# Patient Record
Sex: Female | Born: 1945 | Race: White | Hispanic: No | Marital: Married | Smoking: Never smoker
Health system: Southern US, Community
[De-identification: ages and names within clinical notes are randomized; demographics above are authoritative.]

## PROBLEM LIST (undated history)

## (undated) DIAGNOSIS — M1990 Unspecified osteoarthritis, unspecified site: Secondary | ICD-10-CM

## (undated) DIAGNOSIS — E041 Nontoxic single thyroid nodule: Secondary | ICD-10-CM

## (undated) DIAGNOSIS — I10 Essential (primary) hypertension: Secondary | ICD-10-CM

## (undated) DIAGNOSIS — IMO0001 Reserved for inherently not codable concepts without codable children: Secondary | ICD-10-CM

## (undated) DIAGNOSIS — R011 Cardiac murmur, unspecified: Secondary | ICD-10-CM

## (undated) DIAGNOSIS — K8020 Calculus of gallbladder without cholecystitis without obstruction: Secondary | ICD-10-CM

## (undated) DIAGNOSIS — E079 Disorder of thyroid, unspecified: Secondary | ICD-10-CM

## (undated) DIAGNOSIS — E785 Hyperlipidemia, unspecified: Secondary | ICD-10-CM

## (undated) DIAGNOSIS — R413 Other amnesia: Secondary | ICD-10-CM

## (undated) DIAGNOSIS — R112 Nausea with vomiting, unspecified: Secondary | ICD-10-CM

## (undated) DIAGNOSIS — J189 Pneumonia, unspecified organism: Secondary | ICD-10-CM

## (undated) DIAGNOSIS — E039 Hypothyroidism, unspecified: Secondary | ICD-10-CM

## (undated) DIAGNOSIS — Z9889 Other specified postprocedural states: Secondary | ICD-10-CM

## (undated) DIAGNOSIS — E559 Vitamin D deficiency, unspecified: Secondary | ICD-10-CM

## (undated) HISTORY — DX: Nontoxic single thyroid nodule: E04.1

## (undated) HISTORY — PX: ABDOMINAL HYSTERECTOMY: SHX81

## (undated) HISTORY — PX: EYE SURGERY: SHX253

## (undated) HISTORY — DX: Disorder of thyroid, unspecified: E07.9

## (undated) HISTORY — DX: Vitamin D deficiency, unspecified: E55.9

## (undated) HISTORY — DX: Hyperlipidemia, unspecified: E78.5

## (undated) HISTORY — DX: Essential (primary) hypertension: I10

## (undated) HISTORY — DX: Other amnesia: R41.3

## (undated) HISTORY — PX: BUNIONECTOMY: SHX129

## (undated) HISTORY — PX: TUBAL LIGATION: SHX77

---

## 1999-10-16 ENCOUNTER — Encounter: Payer: Self-pay | Admitting: Family Medicine

## 1999-10-16 ENCOUNTER — Encounter: Admission: RE | Admit: 1999-10-16 | Discharge: 1999-10-16 | Payer: Self-pay | Admitting: Family Medicine

## 1999-10-22 ENCOUNTER — Encounter: Admission: RE | Admit: 1999-10-22 | Discharge: 1999-10-22 | Payer: Self-pay | Admitting: Family Medicine

## 1999-10-22 ENCOUNTER — Encounter: Payer: Self-pay | Admitting: Family Medicine

## 2001-02-26 ENCOUNTER — Encounter: Payer: Self-pay | Admitting: Family Medicine

## 2001-02-26 ENCOUNTER — Encounter: Admission: RE | Admit: 2001-02-26 | Discharge: 2001-02-26 | Payer: Self-pay | Admitting: Family Medicine

## 2002-03-03 ENCOUNTER — Encounter: Payer: Self-pay | Admitting: Family Medicine

## 2002-03-03 ENCOUNTER — Encounter: Admission: RE | Admit: 2002-03-03 | Discharge: 2002-03-03 | Payer: Self-pay | Admitting: Family Medicine

## 2003-07-08 ENCOUNTER — Encounter: Admission: RE | Admit: 2003-07-08 | Discharge: 2003-07-08 | Payer: Self-pay | Admitting: Family Medicine

## 2003-08-31 ENCOUNTER — Encounter: Admission: RE | Admit: 2003-08-31 | Discharge: 2003-11-29 | Payer: Self-pay | Admitting: Family Medicine

## 2003-11-24 ENCOUNTER — Ambulatory Visit (HOSPITAL_COMMUNITY): Admission: RE | Admit: 2003-11-24 | Discharge: 2003-11-24 | Payer: Self-pay | Admitting: Gastroenterology

## 2005-01-29 ENCOUNTER — Other Ambulatory Visit: Admission: RE | Admit: 2005-01-29 | Discharge: 2005-01-29 | Payer: Self-pay | Admitting: Surgery

## 2005-02-14 ENCOUNTER — Encounter: Admission: RE | Admit: 2005-02-14 | Discharge: 2005-02-14 | Payer: Self-pay | Admitting: Family Medicine

## 2005-03-28 ENCOUNTER — Encounter: Admission: RE | Admit: 2005-03-28 | Discharge: 2005-03-28 | Payer: Self-pay | Admitting: Family Medicine

## 2006-02-20 ENCOUNTER — Encounter: Admission: RE | Admit: 2006-02-20 | Discharge: 2006-02-20 | Payer: Self-pay | Admitting: Family Medicine

## 2007-03-19 ENCOUNTER — Encounter: Admission: RE | Admit: 2007-03-19 | Discharge: 2007-03-19 | Payer: Self-pay | Admitting: Family Medicine

## 2008-02-16 ENCOUNTER — Encounter: Admission: RE | Admit: 2008-02-16 | Discharge: 2008-02-16 | Payer: Self-pay | Admitting: Family Medicine

## 2009-02-20 ENCOUNTER — Encounter: Admission: RE | Admit: 2009-02-20 | Discharge: 2009-02-20 | Payer: Self-pay | Admitting: Family Medicine

## 2010-02-22 ENCOUNTER — Encounter: Admission: RE | Admit: 2010-02-22 | Discharge: 2010-02-22 | Payer: Self-pay | Admitting: Family Medicine

## 2010-10-05 NOTE — Op Note (Signed)
NAME:  Lorraine Buckley, Lorraine Buckley                        ACCOUNT NO.:  [id number]   MEDICAL RECORD NO.:  [id number]                   PATIENT TYPE:  AMB   LOCATION:  ENDO                                 FACILITY:  [facility]   PHYSICIAN:  James L. Malon Kindle., M.D.          DATE OF BIRTH:  January 23, 1946   DATE OF PROCEDURE:  11/24/2003  DATE OF DISCHARGE:                                 OPERATIVE REPORT   PROCEDURE:  Colonoscopy.   MEDICATIONS:  Fentanyl 75 mcg, Versed 7 mg IV.   INDICATIONS FOR PROCEDURE:  Screening in woman with three sisters with colon  polyps.   DESCRIPTION OF PROCEDURE:  The procedure had been explained to the patient  and consent obtained. With the patient in the left lateral decubitus  position, the Olympus scope was inserted and advanced.  The prep was  excellent. We were able to reach the cecum without difficulty. The ileocecal  valve and appendiceal orifice were seen. The scope was withdrawn and the  cecum, ascending colon, transverse colon, descending and sigmoid colon were  seen well no polyps seen. The scope was withdrawn. The rectum was free of  polyps.  The patient tolerated the procedure well.   ASSESSMENT:  Family history of colon polyps with negative colonoscopy.   PLAN:  Will recommend yearly Hemoccults and repeat colonoscopy in five  years.                                               James L. Malon Kindle., M.D.    Waldron Session  D:  11/24/2003  T:  11/24/2003  Job:  [id number]   cc:   Caryn Bee L. Little, M.D.  [address]  [place]  [place] [address]  Fax: [phone]

## 2010-10-12 ENCOUNTER — Encounter (INDEPENDENT_AMBULATORY_CARE_PROVIDER_SITE_OTHER): Payer: Self-pay | Admitting: Surgery

## 2011-01-17 ENCOUNTER — Other Ambulatory Visit: Payer: Self-pay | Admitting: Family Medicine

## 2011-01-17 DIAGNOSIS — Z1231 Encounter for screening mammogram for malignant neoplasm of breast: Secondary | ICD-10-CM

## 2011-02-26 ENCOUNTER — Ambulatory Visit
Admission: RE | Admit: 2011-02-26 | Discharge: 2011-02-26 | Disposition: A | Discharge status: Home / Self Care | Payer: 59 | Source: Ambulatory Visit | Attending: Family Medicine | Admitting: Family Medicine

## 2011-02-26 DIAGNOSIS — Z1231 Encounter for screening mammogram for malignant neoplasm of breast: Secondary | ICD-10-CM

## 2011-05-22 ENCOUNTER — Other Ambulatory Visit (INDEPENDENT_AMBULATORY_CARE_PROVIDER_SITE_OTHER): Payer: Self-pay | Admitting: Surgery

## 2011-05-22 DIAGNOSIS — E042 Nontoxic multinodular goiter: Secondary | ICD-10-CM

## 2011-05-31 ENCOUNTER — Encounter (INDEPENDENT_AMBULATORY_CARE_PROVIDER_SITE_OTHER): Payer: Self-pay

## 2011-06-25 ENCOUNTER — Inpatient Hospital Stay (HOSPITAL_COMMUNITY)
Admission: EM | Admit: 2011-06-25 | Discharge: 2011-06-27 | DRG: 202 | Disposition: A | Discharge status: Home / Self Care | Payer: 59 | Attending: Internal Medicine | Admitting: Internal Medicine

## 2011-06-25 ENCOUNTER — Encounter (HOSPITAL_COMMUNITY): Payer: Self-pay | Admitting: Emergency Medicine

## 2011-06-25 ENCOUNTER — Emergency Department (HOSPITAL_COMMUNITY): Payer: 59

## 2011-06-25 DIAGNOSIS — D649 Anemia, unspecified: Secondary | ICD-10-CM | POA: Diagnosis present

## 2011-06-25 DIAGNOSIS — R0902 Hypoxemia: Secondary | ICD-10-CM

## 2011-06-25 DIAGNOSIS — R509 Fever, unspecified: Secondary | ICD-10-CM

## 2011-06-25 DIAGNOSIS — J209 Acute bronchitis, unspecified: Secondary | ICD-10-CM

## 2011-06-25 DIAGNOSIS — E739 Lactose intolerance, unspecified: Secondary | ICD-10-CM | POA: Diagnosis present

## 2011-06-25 DIAGNOSIS — R7309 Other abnormal glucose: Secondary | ICD-10-CM | POA: Diagnosis present

## 2011-06-25 DIAGNOSIS — I10 Essential (primary) hypertension: Secondary | ICD-10-CM | POA: Diagnosis present

## 2011-06-25 DIAGNOSIS — E039 Hypothyroidism, unspecified: Secondary | ICD-10-CM | POA: Diagnosis present

## 2011-06-25 DIAGNOSIS — J189 Pneumonia, unspecified organism: Secondary | ICD-10-CM | POA: Diagnosis present

## 2011-06-25 DIAGNOSIS — R6889 Other general symptoms and signs: Secondary | ICD-10-CM

## 2011-06-25 DIAGNOSIS — J45909 Unspecified asthma, uncomplicated: Secondary | ICD-10-CM | POA: Diagnosis not present

## 2011-06-25 HISTORY — DX: Other general symptoms and signs: R68.89

## 2011-06-25 HISTORY — DX: Acute bronchitis, unspecified: J20.9

## 2011-06-25 HISTORY — DX: Essential (primary) hypertension: I10

## 2011-06-25 HISTORY — DX: Hypothyroidism, unspecified: E03.9

## 2011-06-25 HISTORY — DX: Cardiac murmur, unspecified: R01.1

## 2011-06-25 HISTORY — DX: Unspecified osteoarthritis, unspecified site: M19.90

## 2011-06-25 LAB — CBC
Hemoglobin: 14 g/dL (ref 12.0–15.0)
MCH: 28.4 pg (ref 26.0–34.0)
Platelets: 215 10*3/uL (ref 150–400)
RBC: 4.93 MIL/uL (ref 3.87–5.11)
WBC: 15.8 10*3/uL — ABNORMAL HIGH (ref 4.0–10.5)

## 2011-06-25 LAB — POCT I-STAT, CHEM 8
BUN: 10 mg/dL (ref 6–23)
Creatinine, Ser: 0.8 mg/dL (ref 0.50–1.10)
Potassium: 4 mEq/L (ref 3.5–5.1)
Sodium: 137 mEq/L (ref 135–145)
TCO2: 23 mmol/L (ref 0–100)

## 2011-06-25 LAB — INFLUENZA PANEL BY PCR (TYPE A & B): Influenza B By PCR: NEGATIVE

## 2011-06-25 MED ORDER — DEXTROSE 5 % IV SOLN
1.0000 g | Freq: Once | INTRAVENOUS | Status: AC
  Administered 2011-06-25: 1 g via INTRAVENOUS
  Filled 2011-06-25: qty 10

## 2011-06-25 MED ORDER — SODIUM CHLORIDE 0.9 % IV BOLUS (SEPSIS)
1000.0000 mL | Freq: Once | INTRAVENOUS | Status: AC
  Administered 2011-06-25: 1000 mL via INTRAVENOUS

## 2011-06-25 MED ORDER — ALBUTEROL SULFATE HFA 108 (90 BASE) MCG/ACT IN AERS: 2.0000 | INHALATION_SPRAY | RESPIRATORY_TRACT | Status: DC | PRN

## 2011-06-25 MED ORDER — OSELTAMIVIR PHOSPHATE 75 MG PO CAPS
75.0000 mg | ORAL_CAPSULE | Freq: Two times a day (BID) | ORAL | Status: DC
  Administered 2011-06-25: 75 mg via ORAL
  Filled 2011-06-25 (×2): qty 1

## 2011-06-25 MED ORDER — ALBUTEROL SULFATE (5 MG/ML) 0.5% IN NEBU
5.0000 mg | INHALATION_SOLUTION | Freq: Once | RESPIRATORY_TRACT | Status: AC
  Administered 2011-06-25: 5 mg via RESPIRATORY_TRACT
  Filled 2011-06-25: qty 1

## 2011-06-25 MED ORDER — LEVOTHYROXINE SODIUM 100 MCG PO TABS
100.0000 ug | ORAL_TABLET | Freq: Every day | ORAL | Status: DC
  Administered 2011-06-25 – 2011-06-27 (×3): 100 ug via ORAL
  Filled 2011-06-25 (×4): qty 1

## 2011-06-25 MED ORDER — ACETAMINOPHEN 500 MG PO TABS
1000.0000 mg | ORAL_TABLET | ORAL | Status: AC
  Filled 2011-06-25: qty 2

## 2011-06-25 MED ORDER — SODIUM CHLORIDE 0.9 % IJ SOLN
3.0000 mL | Freq: Two times a day (BID) | INTRAMUSCULAR | Status: DC
  Administered 2011-06-26 – 2011-06-27 (×2): 3 mL via INTRAVENOUS

## 2011-06-25 MED ORDER — ACETAMINOPHEN 325 MG PO TABS
650.0000 mg | ORAL_TABLET | Freq: Four times a day (QID) | ORAL | Status: DC | PRN
  Administered 2011-06-25 – 2011-06-27 (×4): 650 mg via ORAL
  Filled 2011-06-25 (×4): qty 2

## 2011-06-25 MED ORDER — AZITHROMYCIN 250 MG PO TABS
500.0000 mg | ORAL_TABLET | Freq: Once | ORAL | Status: AC
  Administered 2011-06-25: 500 mg via ORAL
  Filled 2011-06-25: qty 2

## 2011-06-25 MED ORDER — ENOXAPARIN SODIUM 40 MG/0.4ML ~~LOC~~ SOLN
40.0000 mg | SUBCUTANEOUS | Status: DC
  Administered 2011-06-25 – 2011-06-27 (×3): 40 mg via SUBCUTANEOUS
  Filled 2011-06-25 (×3): qty 0.4

## 2011-06-25 MED ORDER — SODIUM CHLORIDE 0.9 % IV SOLN: 250.0000 mL | INTRAVENOUS | Status: DC | PRN

## 2011-06-25 MED ORDER — SODIUM CHLORIDE 0.9 % IV SOLN: INTRAVENOUS | Status: DC

## 2011-06-25 MED ORDER — SODIUM CHLORIDE 0.9 % IV SOLN
INTRAVENOUS | Status: DC
  Administered 2011-06-25 – 2011-06-26 (×3): 100 mL/h via INTRAVENOUS
  Administered 2011-06-26: via INTRAVENOUS

## 2011-06-25 MED ORDER — ONDANSETRON HCL 4 MG/2ML IJ SOLN
4.0000 mg | Freq: Four times a day (QID) | INTRAMUSCULAR | Status: DC | PRN
  Administered 2011-06-25: 4 mg via INTRAVENOUS
  Filled 2011-06-25: qty 2

## 2011-06-25 MED ORDER — ALBUTEROL SULFATE (5 MG/ML) 0.5% IN NEBU: 5.0000 mg | INHALATION_SOLUTION | RESPIRATORY_TRACT | Status: DC | PRN

## 2011-06-25 MED ORDER — GUAIFENESIN-DM 100-10 MG/5ML PO SYRP
5.0000 mL | ORAL_SOLUTION | ORAL | Status: DC | PRN
  Filled 2011-06-25: qty 5

## 2011-06-25 MED ORDER — OSELTAMIVIR PHOSPHATE 75 MG PO CAPS: 75.0000 mg | ORAL_CAPSULE | Freq: Two times a day (BID) | ORAL | Status: DC

## 2011-06-25 MED ORDER — ACETAMINOPHEN 325 MG PO TABS
ORAL_TABLET | ORAL | Status: AC
  Administered 2011-06-25: 975 mg
  Filled 2011-06-25: qty 3

## 2011-06-25 MED ORDER — SODIUM CHLORIDE 0.9 % IJ SOLN: 3.0000 mL | INTRAMUSCULAR | Status: DC | PRN

## 2011-06-25 MED ORDER — MOXIFLOXACIN HCL IN NACL 400 MG/250ML IV SOLN
400.0000 mg | INTRAVENOUS | Status: DC
  Administered 2011-06-25 – 2011-06-27 (×3): 400 mg via INTRAVENOUS
  Filled 2011-06-25 (×3): qty 250

## 2011-06-25 MED ORDER — ACETAMINOPHEN 650 MG RE SUPP: 650.0000 mg | Freq: Four times a day (QID) | RECTAL | Status: DC | PRN

## 2011-06-25 MED ORDER — OSELTAMIVIR PHOSPHATE 75 MG PO CAPS
75.0000 mg | ORAL_CAPSULE | ORAL | Status: AC
  Administered 2011-06-25: 75 mg via ORAL
  Filled 2011-06-25: qty 1

## 2011-06-25 MED ORDER — ALBUTEROL SULFATE (5 MG/ML) 0.5% IN NEBU
2.5000 mg | INHALATION_SOLUTION | Freq: Four times a day (QID) | RESPIRATORY_TRACT | Status: DC
  Administered 2011-06-25 – 2011-06-27 (×9): 2.5 mg via RESPIRATORY_TRACT
  Filled 2011-06-25 (×8): qty 0.5

## 2011-06-25 MED ORDER — BIOTENE DRY MOUTH MT LIQD
15.0000 mL | Freq: Two times a day (BID) | OROMUCOSAL | Status: DC
  Administered 2011-06-25 – 2011-06-27 (×4): 15 mL via OROMUCOSAL

## 2011-06-25 MED ORDER — ALBUTEROL SULFATE (5 MG/ML) 0.5% IN NEBU
2.5000 mg | INHALATION_SOLUTION | RESPIRATORY_TRACT | Status: DC | PRN
  Filled 2011-06-25: qty 0.5

## 2011-06-25 MED ORDER — ONDANSETRON HCL 4 MG/2ML IJ SOLN
4.0000 mg | Freq: Once | INTRAMUSCULAR | Status: AC
  Administered 2011-06-25: 4 mg via INTRAVENOUS
  Filled 2011-06-25: qty 2

## 2011-06-25 NOTE — ED Notes (Signed)
Pt. Feeling better, watching TV, family at bedside.

## 2011-06-25 NOTE — ED Notes (Signed)
Oxygen applied via [place] 2 L

## 2011-06-25 NOTE — ED Notes (Signed)
Patient stated her and her husband went to [facility] and returned here Sunday night.  Patient stated she started with cough and not feeling well after returning home.  +cough yellowish

## 2011-06-25 NOTE — ED Notes (Addendum)
Pt. Having mild nasuea, reported to Dr. Eddie North, MD.  Waiting for orders. Pt. Given coke with ice.

## 2011-06-25 NOTE — Progress Notes (Signed)
Pt arrived to the unit with admitting dx: flu like symptoms. She is alert and oriented x4. Ambulatory. Continent of bowel and bladder. She has no skin break down noted. Pt placed on droplet precautions to r/o flu. Flu PCR was done and sent to the lab. Pt denies any pain. Complains of nausea. Is on 2L O2 via [place]. Pt from home with husband. VS stable. No distress noted. Will cont to monitor.

## 2011-06-25 NOTE — ED Provider Notes (Signed)
History     CSN: [id number]  Arrival date & time 06/25/11  0130   None     Chief Complaint  Patient presents with  . Shortness of Breath    (Consider location/radiation/quality/duration/timing/severity/associated sxs/prior treatment) HPI Complains of cough productive of yellow sputum for one week, worse over the past 24 hours with mild shortness of breath nausea and lightheadedness with standing. Feels dehydrated. Treated with her albuterol inhaler and Pulmicort inhaler with partial relief and also complains of mild sore throat headache and myalgias. Nothing makes symptoms better or worse symptoms are constant moderate. No other associated symptoms Past Medical History  Diagnosis Date  . Thyroid disease   . Asthma   . Hypothyroidism   . Hypertension     Past Surgical History  Procedure Date  . Abdominal hysterectomy     Family History  Problem Relation Age of Onset  . Asthma Mother   . Hypertension Mother     History  Substance Use Topics  . Smoking status: Never Smoker   . Smokeless tobacco: Not on file  . Alcohol Use: No    OB History    Grav Para Term Preterm Abortions TAB SAB Ect Mult Living                  Review of Systems  Constitutional: Positive for fever.  HENT:       Sore throat  Respiratory: Positive for cough.   Cardiovascular: Negative.   Gastrointestinal: Positive for nausea.  Musculoskeletal: Positive for myalgias.  Skin: Negative.   Neurological:       Headache  Hematological: Negative.   Psychiatric/Behavioral: Negative.     Allergies  Penicillins  Home Medications   Current Outpatient Rx  Name Route Sig Dispense Refill  . ALBUTEROL SULFATE HFA 108 (90 BASE) MCG/ACT IN AERS Inhalation Inhale 2 puffs into the lungs every 4 (four) hours as needed. For breathing    . AMLODIPINE BESYLATE 2.5 MG PO TABS Oral Take 2.5 mg by mouth daily.    . BUDESONIDE 180 MCG/ACT IN AEPB Inhalation Inhale 1 puff into the lungs 2 (two) times daily.     [name] LEVOTHYROXINE SODIUM 100 MCG PO TABS Oral Take 100 mcg by mouth daily.      BP 185/100  Pulse 110  Temp(Src) 100.4 F (38 C) (Oral)  Resp 19  SpO2 90%  Physical Exam  Nursing note and vitals reviewed. Constitutional: She appears well-developed and well-nourished.  HENT:  Head: Normocephalic and atraumatic.       Mucous membranes dry, oral pharynx minimally reddened no exudate  Eyes: Conjunctivae are normal. Pupils are equal, round, and reactive to light.  Neck: Neck supple. No tracheal deviation present. No thyromegaly present.  Cardiovascular: Normal rate and regular rhythm.   No murmur heard. Pulmonary/Chest: Effort normal.       Scant diffuse rhonchi  Abdominal: Soft. Bowel sounds are normal. She exhibits no distension. There is no tenderness.  Musculoskeletal: Normal range of motion. She exhibits no edema and no tenderness.  Lymphadenopathy:    She has no cervical adenopathy.  Neurological: She is alert. Coordination normal.  Skin: Skin is warm and dry. No rash noted.  Psychiatric: She has a normal mood and affect.    ED Course  Procedures (including critical care time) Patient felt improved after treatment with intravenous fluids and albuterol neb, how however pulse oximetry drop down to 90% on room air and patient becomes mildly dyspneic Labs Reviewed - No data to  display Dg Chest 2 View  06/25/2011  *RADIOLOGY REPORT*  Clinical Data: Cough, congestion, fever  CHEST - 2 VIEW  Comparison: None.  Findings: Mild perihilar and bibasilar interstitial prominence.  No focal infiltrate or overt edema.  No effusion.  Heart size upper limits normal.  Regional bones unremarkable.  IMPRESSION:  1.  Mild central interstitial prominence, age indeterminate.  No focal infiltrate.  Original Report Authenticated By: Osa Craver, M.D.   Results for orders placed during the hospital encounter of 06/25/11  CBC      Component Value Range   WBC 15.8 (*) 4.0 - 10.5 (K/uL)   RBC  4.93  3.87 - 5.11 (MIL/uL)   Hemoglobin 14.0  12.0 - 15.0 (g/dL)   HCT 16.1  09.6 - 04.5 (%)   MCV 85.0  78.0 - 100.0 (fL)   MCH 28.4  26.0 - 34.0 (pg)   MCHC 33.4  30.0 - 36.0 (g/dL)   RDW 40.9  81.1 - 91.4 (%)   Platelets 215  150 - 400 (K/uL)  POCT I-STAT, CHEM 8      Component Value Range   Sodium 137  135 - 145 (mEq/L)   Potassium 4.0  3.5 - 5.1 (mEq/L)   Chloride 104  96 - 112 (mEq/L)   BUN 10  6 - 23 (mg/dL)   Creatinine, Ser 7.82  0.50 - 1.10 (mg/dL)   Glucose, Bld 956 (*) 70 - 99 (mg/dL)   Calcium, Ion 2.13  0.86 - 1.32 (mmol/L)   TCO2 23  0 - 100 (mmol/L)   Hemoglobin 14.6  12.0 - 15.0 (g/dL)   HCT 57.8  46.9 - 62.9 (%)   Dg Chest 2 View  06/25/2011  *RADIOLOGY REPORT*  Clinical Data: Cough, congestion, fever  CHEST - 2 VIEW  Comparison: None.  Findings: Mild perihilar and bibasilar interstitial prominence.  No focal infiltrate or overt edema.  No effusion.  Heart size upper limits normal.  Regional bones unremarkable.  IMPRESSION:  1.  Mild central interstitial prominence, age indeterminate.  No focal infiltrate.  Original Report Authenticated By: Osa Craver, M.D.    No diagnosis found.    MDM  Symptoms consistent with influenza or pneumonia Patient has oxygen requirement based on symptoms and pulse oximetry Spoke with Dr.Kakrakndy  Plan 23 hour observation , telemetry, oxygen, nebulized treatments as needed, tamiflu  Diagnosis: #16febrile respiratory illness  #2 hypoxia #3hyperglycemia     Doug Sou, MD 06/25/11 [phone]

## 2011-06-25 NOTE — ED Notes (Signed)
PT. REPORTS SOB WITH PRODUCTIVE COUGH ONSET LAST Friday  , NAUSEA AND CHILLS WITH LOW GRADE FEVER THIS EVENING.

## 2011-06-25 NOTE — H&P (Signed)
PCP:  Mickie Hillier, MD, MD   DOA:  06/25/2011  1:32 AM  Chief Complaint:  Productive cough with fever since 3-4 days  HPI: 66 y/o female with hx of hypothyroidism, occasional asthma and recently diagnosed HTN ( not started on her prescription amlodipine yet) resented to ED with cough, fever and sore throat for past 3-4 days. patient and her husband recently had a trip to [place] and was experiencing cough with body aches. She returned home 2 days back and her cough was progressive with yellowish phlegm and subjective fever with chills and bodyaches. Also had some sore throat and chest congestion. She informs having some nausea today but no vomiting. Denies bowel or urinary symptoms. Denies chest pain or  palpitation. Denies hemoptysis or leg swellings. She denies any sick contacts. In the ED she was noted to be febrile to 100.4.   Allergies: Allergies  Allergen Reactions  . Penicillins Other (See Comments)    unknown    Prior to Admission medications   Medication Sig Start Date End Date Taking? Authorizing Provider  albuterol (PROVENTIL HFA;VENTOLIN HFA) 108 (90 BASE) MCG/ACT inhaler Inhale 2 puffs into the lungs every 4 (four) hours as needed. For breathing   Yes Historical Provider, MD  amLODipine (NORVASC) 2.5 MG tablet Take 2.5 mg by mouth daily.   Yes Historical Provider, MD  budesonide (PULMICORT) 180 MCG/ACT inhaler Inhale 1 puff into the lungs 2 (two) times daily.   Yes Historical Provider, MD  levothyroxine (SYNTHROID, LEVOTHROID) 100 MCG tablet Take 100 mcg by mouth daily.   Yes Historical Provider, MD    Past Medical History  Diagnosis Date  . Thyroid disease   . Asthma   . Hypothyroidism   . Hypertension     Past Surgical History  Procedure Date  . Abdominal hysterectomy     Social History:  reports that she has never smoked. She does not have any smokeless tobacco history on file. She reports that she does not drink alcohol or use illicit drugs.  Family History   Problem Relation Age of Onset  . Asthma Mother   . Hypertension Mother     Review of Systems:  Constitutional: fever, chills, denies diaphoresis, appetite change and fatigue.  HEENT: Denies photophobia, eye pain, redness, hearing loss, ear pain, congestion, sore throat, rhinorrhea, sneezing, mouth sores, trouble swallowing, neck pain, neck stiffness and tinnitus.   Respiratory: mild  SOB, DOE, cough with yellowish phlegm, chest tightness,  and wheezing.   Cardiovascular: Denies chest pain, palpitations and leg swelling.  Gastrointestinal: had nausea this morning, denies vomiting, abdominal pain, diarrhea, constipation, blood in stool and abdominal distention.  Genitourinary: Denies dysuria, urgency, frequency, hematuria, flank pain and difficulty urinating.  Musculoskeletal: Denies myalgias, back pain, joint swelling, arthralgias and gait problem.  Skin: Denies pallor, rash and wound.  Neurological: Denies dizziness, seizures, syncope, weakness, light-headedness, numbness and headaches.  Hematological: Denies adenopathy. Easy bruising, personal or family bleeding history  Psychiatric/Behavioral: Denies suicidal ideation, mood changes, confusion, nervousness, sleep disturbance and agitation   Physical Exam:  Filed Vitals:   06/25/11 0500 06/25/11 0515 06/25/11 0523 06/25/11 0743  BP:   135/74 114/59  Pulse: 120 116 112   Temp:   99.5 F (37.5 C) 98.4 F (36.9 C)  TempSrc:   Oral Oral  Resp:   20 20  SpO2: 95% 93% 91% 98%    Constitutional: Vital signs reviewed.  Patient is a well-developed and well-nourished in no acute distress and cooperative with exam. Head:  Normocephalic and atraumatic Ear: TM normal bilaterally Mouth: no erythema or exudates, MMM Eyes: PERRL, EOMI, conjunctivae normal, No scleral icterus.  Neck: Supple, Trachea midline normal ROM, No JVD, mass, thyromegaly, or carotid bruit present.  Cardiovascular: RRR, S1 normal, S2 normal, no MRG, pulses symmetric and  intact bilaterally Pulmonary/Chest: CTAB, no wheezes, rales, or rhonchi Abdominal: Soft. Non-tender, non-distended, bowel sounds are normal, no masses, organomegaly, or guarding present.  GU: no CVA tenderness Musculoskeletal: No joint deformities, erythema, or stiffness, ROM full and no nontender Ext: no edema and no cyanosis, pulses palpable bilaterally (DP and PT) Hematology: no cervical, inginal, or axillary adenopathy.  Neurological: A&O x3, Strenght is normal and symmetric bilaterally, cranial nerve II-XII are grossly intact, no focal motor deficit, sensory intact to light touch bilaterally.  Skin: Warm, dry and intact. No rash, cyanosis, or clubbing.  Psychiatric: Normal mood and affect. speech and behavior is normal. Judgment and thought content normal. Cognition and memory are normal.   Labs on Admission:  Results for orders placed during the hospital encounter of 06/25/11 (from the past 48 hour(s))  POCT I-STAT, CHEM 8     Status: Abnormal   Collection Time   06/25/11  3:14 AM      Component Value Range Comment   Sodium 137  135 - 145 (mEq/L)    Potassium 4.0  3.5 - 5.1 (mEq/L)    Chloride 104  96 - 112 (mEq/L)    BUN 10  6 - 23 (mg/dL)    Creatinine, Ser 1.61  0.50 - 1.10 (mg/dL)    Glucose, Bld 096 (*) 70 - 99 (mg/dL)    Calcium, Ion 0.45  1.12 - 1.32 (mmol/L)    TCO2 23  0 - 100 (mmol/L)    Hemoglobin 14.6  12.0 - 15.0 (g/dL)    HCT 40.9  81.1 - 91.4 (%)   CBC     Status: Abnormal   Collection Time   06/25/11  3:16 AM      Component Value Range Comment   WBC 15.8 (*) 4.0 - 10.5 (K/uL)    RBC 4.93  3.87 - 5.11 (MIL/uL)    Hemoglobin 14.0  12.0 - 15.0 (g/dL)    HCT 78.2  95.6 - 21.3 (%)    MCV 85.0  78.0 - 100.0 (fL)    MCH 28.4  26.0 - 34.0 (pg)    MCHC 33.4  30.0 - 36.0 (g/dL)    RDW 08.6  57.8 - 46.9 (%)    Platelets 215  150 - 400 (K/uL)     Radiological Exams on Admission: CXR wnl   Assessment  66 y/o female with hypothyroidism presented with flu like  illness and acute bronchitis  PLAN:  Flu like illness  admit under observation  droplet precaution  will check flu PCR. Patient started on tamiflu Monitor temp and wbc ( elevated on presentation)  CXR negative for PNA cont IV fluids and albuterol nebs Prn zofran  Acute bronchitis  will switch abx to avelox Cont nebs Tylenol prn  Hypothyroidism Cont synthroid  Hypertension  currently stable  has not started on prescription meds at home ( amlodipine)   will stat while in hospital   DVT prophylaxis  Diet: low Na   full code Time Spent on Admission: 55 minutes  Lorraine Buckley 06/25/2011, 8:06 AM

## 2011-06-26 ENCOUNTER — Inpatient Hospital Stay (HOSPITAL_COMMUNITY): Payer: 59

## 2011-06-26 LAB — CBC
HCT: 33.1 % — ABNORMAL LOW (ref 36.0–46.0)
Hemoglobin: 10.7 g/dL — ABNORMAL LOW (ref 12.0–15.0)
MCH: 28.4 pg (ref 26.0–34.0)
RBC: 3.77 MIL/uL — ABNORMAL LOW (ref 3.87–5.11)

## 2011-06-26 NOTE — Progress Notes (Signed)
Utilization review complete 

## 2011-06-26 NOTE — Progress Notes (Signed)
Subjective: Chart reviewed. Overall patient says she's feeling much better. Improving cough and sputum is clearing. Denies dyspnea. Good appetite. No history of sleep apnea.  Objective: Blood pressure 139/68, pulse 85, temperature 97.7 F (36.5 C), temperature source Oral, resp. rate 18, height 5\' 7"  (1.702 m), weight 103.6 kg (228 lb 6.3 oz), SpO2 96.00%.  Intake/Output Summary (Last 24 hours) at 06/26/11 1750 Last data filed at 06/26/11 1300  Gross per 24 hour  Intake   1909 ml  Output      1 ml  Net   1908 ml    General Exam: Comfortable. Respiratory System: Reduced breath sounds in the bases with bibasal crackles, right greater than left. Rest of the lung fields are clear. No increased work of breathing. Cardiovascular System: First and second heart sounds heard. Regular rate and rhythm. No JVD/murmurs or pedal edema. Gastrointestinal System: Abdomen is non distended, soft and normal bowel sounds heard. Central Nervous System: Alert and oriented. No focal neurological deficits.  Lab Results: Basic Metabolic Panel:  Basename 06/25/11 0314  NA 137  K 4.0  CL 104  CO2 --  GLUCOSE 162*  BUN 10  CREATININE 0.80  CALCIUM --  MG --  PHOS --   Liver Function Tests: No results found for this basename: AST:2,ALT:2,ALKPHOS:2,BILITOT:2,PROT:2,ALBUMIN:2 in the last 72 hours No results found for this basename: LIPASE:2,AMYLASE:2 in the last 72 hours No results found for this basename: AMMONIA:2 in the last 72 hours CBC:  Basename 06/26/11 0540 06/25/11 0316  WBC 18.6* 15.8*  NEUTROABS -- --  HGB 10.7* 14.0  HCT 33.1* 41.9  MCV 87.8 85.0  PLT PLATELETS APPEAR ADEQUATE 215   Cardiac Enzymes: No results found for this basename: CKTOTAL:3,CKMB:3,CKMBINDEX:3,TROPONINI:3 in the last 72 hours BNP: No results found for this basename: PROBNP:3 in the last 72 hours D-Dimer: No results found for this basename: DDIMER:2 in the last 72 hours CBG: No results found for this basename:  GLUCAP:6 in the last 72 hours Hemoglobin A1C: No results found for this basename: HGBA1C in the last 72 hours Fasting Lipid Panel: No results found for this basename: CHOL,HDL,LDLCALC,TRIG,CHOLHDL,LDLDIRECT in the last 72 hours Thyroid Function Tests: No results found for this basename: TSH,T4TOTAL,FREET4,T3FREE,THYROIDAB in the last 72 hours Anemia Panel: No results found for this basename: VITAMINB12,FOLATE,FERRITIN,TIBC,IRON,RETICCTPCT in the last 72 hours Coagulation: No results found for this basename: LABPROT:2,INR:2 in the last 72 hours Urine Drug Screen: Drugs of Abuse  No results found for this basename: labopia,  cocainscrnur,  labbenz,  amphetmu,  thcu,  labbarb    Alcohol Level: No results found for this basename: ETH:2 in the last 72 hours Urinalysis: No results found for this basename: COLORURINE:2,APPERANCEUR:2,LABSPEC:2,PHURINE:2,GLUCOSEU:2,HGBUR:2,BILIRUBINUR:2,KETONESUR:2,PROTEINUR:2,UROBILINOGEN:2,NITRITE:2,LEUKOCYTESUR:2 in the last 72 hours   Micro Results: No results found for this or any previous visit (from the past 240 hour(s)).  Studies/Results: Dg Chest 2 View  06/25/2011  *RADIOLOGY REPORT*  Clinical Data: Cough, congestion, fever  CHEST - 2 VIEW  Comparison: None.  Findings: Mild perihilar and bibasilar interstitial prominence.  No focal infiltrate or overt edema.  No effusion.  Heart size upper limits normal.  Regional bones unremarkable.  IMPRESSION:  1.  Mild central interstitial prominence, age indeterminate.  No focal infiltrate.  Original Report Authenticated By: Osa Craver, M.D.    Medications: Scheduled Meds:    . acetaminophen  1,000 mg Oral To Major  . albuterol  2.5 mg Nebulization Q6H  . antiseptic oral rinse  15 mL Mouth Rinse BID  . enoxaparin  40 mg Subcutaneous Q24H  . levothyroxine  100 mcg Oral Daily  . moxifloxacin  400 mg Intravenous Q24H  . sodium chloride  3 mL Intravenous Q12H  . DISCONTD: oseltamivir  75 mg Oral  BID   Continuous Infusions:    . sodium chloride 100 mL/hr (06/26/11 0944)   PRN Meds:.sodium chloride, acetaminophen, acetaminophen, albuterol, albuterol, guaiFENesin-dextromethorphan, ondansetron (ZOFRAN) IV, sodium chloride  Assessment/Plan: 1. Acute purulent bronchitis. Improving. No history of smoking. Influenza panel negative. Repeat CXR to rule out Pneumonia. Continue Avelox. 2. Anemia: ? dilutional. Followup CBC tomorrow. 3. Hyperglycemia: Check hemoglobin A1c. 4. Hypothyroidism 5. Hypertension: Controlled off medications   Disposition: Possible discharge tomorrow. Discussed with patient spouse who was at the bedside.  HONGALGI,ANAND 06/26/2011, 5:50 PM

## 2011-06-26 NOTE — Progress Notes (Signed)
Pt's O2 was turned off to see how pt would do on room air and her sats dropped to 89-90%. Pt denied any sob and no distress present. Pt was put back on 2L sats increased to 94%. Will cont to monitor.

## 2011-06-27 LAB — CBC
HCT: 34.5 % — ABNORMAL LOW (ref 36.0–46.0)
MCH: 27.9 pg (ref 26.0–34.0)
MCV: 87.6 fL (ref 78.0–100.0)
RDW: 14.2 % (ref 11.5–15.5)
WBC: 9.3 10*3/uL (ref 4.0–10.5)

## 2011-06-27 LAB — BASIC METABOLIC PANEL
BUN: 8 mg/dL (ref 6–23)
CO2: 24 mEq/L (ref 19–32)
Chloride: 107 mEq/L (ref 96–112)
Creatinine, Ser: 0.69 mg/dL (ref 0.50–1.10)
GFR calc Af Amer: >90 mL/min (ref >90)

## 2011-06-27 MED ORDER — MOXIFLOXACIN HCL 400 MG PO TABS: 400.0000 mg | ORAL_TABLET | Freq: Every day | ORAL | Status: AC

## 2011-06-27 NOTE — Progress Notes (Signed)
06/27/2011 [facility], Bosie Clos SPARKS Case Management Note [phone]    CARE MANAGEMENT NOTE 06/27/2011  Patient:  Lorraine Buckley, Lorraine Buckley   Account Number:  [id number]  Date Initiated:  06/26/2011  Documentation initiated by:  Fransico Michael  Subjective/Objective Assessment:   admitted on 06/25/11 with productive cough and fever     Action/Plan:   prior to admission, patient lived at home with family. Independent with ADLs   Anticipated DC Date:  06/30/2011   Anticipated DC Plan:  HOME/SELF CARE      DC Planning Services  CM consult      Choice offered to / List presented to:             Status of service:  In process, will continue to follow Medicare Important Message given?   (If response is "NO", the following Medicare IM given date fields will be blank) Date Medicare IM given:   Date Additional Medicare IM given:    Discharge Disposition:    Per UR Regulation:  Reviewed for med. necessity/level of care/duration of stay  Comments:  06/26/11-1437-J.Lutricia Horsfall  [phone]      66yo female admitted on 06/25/11 with productive cough and fever. Prior to admission, patient lived at home with family support. Independent with ADLs. In to see patient. No home needs or discharge needs identified. CM will continue to monitor.

## 2011-06-27 NOTE — Discharge Summary (Signed)
Discharge Summary  Lorraine Buckley MR#: [id number]  DOB:1945/11/08  Date of Admission: 06/25/2011 Date of Discharge: 06/27/2011  Patient's PCP: Mickie Hillier, MD, MD  Attending Physician:Abb Gobert  Consults: None  Discharge Diagnoses: 1. Acute bronchitis Vs ?? Community Acquired Pneumonia. 2. Anemia 3. Hyperglycemia/glucose intolerance 4. Hypothyroidism 5. Hypertension 6. Asthma   Brief Admitting History and Physical 66 y/o female with hx of hypothyroidism, occasional asthma and recently diagnosed HTN ( not started on her prescription amlodipine yet) resented to ED with cough, fever and sore throat for past 3-4 days. patient and her husband recently had a trip to [place] and was experiencing cough with body aches. She returned home 2 days back and her cough was progressive with yellowish phlegm and subjective fever with chills and bodyaches. Also had some sore throat and chest congestion.She denied any sick contacts. In the ED she was noted to be febrile to 100.4. She was mildly tachycardic. Lung exam was clear. White cell count was 16,000. Chest x-ray did not show any obvious infiltrate.    Discharge Medications Current Discharge Medication List    START taking these medications   Details  moxifloxacin (AVELOX) 400 MG tablet Take 1 tablet (400 mg total) by mouth daily. Qty: 2 tablet, Refills: 0      CONTINUE these medications which have NOT CHANGED   Details  albuterol (PROVENTIL HFA;VENTOLIN HFA) 108 (90 BASE) MCG/ACT inhaler Inhale 2 puffs into the lungs every 4 (four) hours as needed. For breathing    amLODipine (NORVASC) 2.5 MG tablet Take 2.5 mg by mouth daily.    budesonide (PULMICORT) 180 MCG/ACT inhaler Inhale 1 puff into the lungs 2 (two) times daily.    levothyroxine (SYNTHROID, LEVOTHROID) 100 MCG tablet Take 100 mcg by mouth daily.      STOP taking these medications     Chlorpheniramine Maleate (ALLERGY PO) Comments:  Reason for Stopping:       cycloSPORINE (RESTASIS) 0.05 % ophthalmic emulsion Comments:  Reason for Stopping:       thyroid (ARMOUR) 120 MG tablet Comments:  Reason for Stopping:          Hospital Course: 1. Acute purulent bronchitis versus ?? Community Acquired PNA. Patient had flulike symptoms. She was initially placed on droplet isolation and received a dose of Tamiflu. These were discontinued once her influenza panel was negative. She was started on intravenous moxifloxacin (day 3), bronchodilator nebulizations and oxygen. With these measures patient has made a good recovery. She has mild cough with sputum that is clearing up. She denies dyspnea or chest pain. She complains of an occasional wheeze. Her appetite is improved. Her oxygen saturations on room air up to 96%. She will complete a total of 7 days course of antibiotics.   2. Anemia: Stable. Outpatient followup 3. Hyperglycemia/glucose intolerance: Recommend diet and exercise and weight loss. Patient has family history of diabetes.. 4. Hypothyroidism: Continue Synthroid 5. Hypertension: A new prescription has been called in by her primary care physician which she has not picked up yet. She is advised to start the amlodipine.   Day of Discharge BP 164/96  Pulse 64  Temp(Src) 98.4 F (36.9 C) (Oral)  Resp 20  Ht 5\' 7"  (1.702 m)  Wt 104.6 kg (230 lb 9.6 oz)  BMI 36.12 kg/m2  SpO2 96% General Exam: Comfortable.  Respiratory System: Reduced breath sounds in the bases with bibasal crackles, right greater than left(improving). Rest of the lung fields are clear. No increased work of breathing.  Cardiovascular System:  First and second heart sounds heard. Regular rate and rhythm. No JVD/murmurs or pedal edema.  Gastrointestinal System: Abdomen is non distended, soft and normal bowel sounds heard.  Central Nervous System: Alert and oriented. No focal neurological deficits.  Results for orders placed during the hospital encounter of 06/25/11 (from the past 48  hour(s))  CBC     Status: Abnormal   Collection Time   06/26/11  5:40 AM      Component Value Range Comment   WBC 18.6 (*) 4.0 - 10.5 (K/uL) WHITE COUNT CONFIRMED ON SMEAR   RBC 3.77 (*) 3.87 - 5.11 (MIL/uL)    Hemoglobin 10.7 (*) 12.0 - 15.0 (g/dL)    HCT 16.1 (*) 09.6 - 46.0 (%)    MCV 87.8  78.0 - 100.0 (fL)    MCH 28.4  26.0 - 34.0 (pg)    MCHC 32.3  30.0 - 36.0 (g/dL)    RDW 04.5  40.9 - 81.1 (%)    Platelets PLATELETS APPEAR ADEQUATE  150 - 400 (K/uL) PLATELET COUNT CONFIRMED BY SMEAR  CBC     Status: Abnormal   Collection Time   06/27/11  6:47 AM      Component Value Range Comment   WBC 9.3  4.0 - 10.5 (K/uL)    RBC 3.94  3.87 - 5.11 (MIL/uL)    Hemoglobin 11.0 (*) 12.0 - 15.0 (g/dL)    HCT 91.4 (*) 78.2 - 46.0 (%)    MCV 87.6  78.0 - 100.0 (fL)    MCH 27.9  26.0 - 34.0 (pg)    MCHC 31.9  30.0 - 36.0 (g/dL)    RDW 95.6  21.3 - 08.6 (%)    Platelets 208  150 - 400 (K/uL)   BASIC METABOLIC PANEL     Status: Normal   Collection Time   06/27/11  6:47 AM      Component Value Range Comment   Sodium 141  135 - 145 (mEq/L)    Potassium 3.6  3.5 - 5.1 (mEq/L)    Chloride 107  96 - 112 (mEq/L)    CO2 24  19 - 32 (mEq/L)    Glucose, Bld 98  70 - 99 (mg/dL)    BUN 8  6 - 23 (mg/dL)    Creatinine, Ser 5.78  0.50 - 1.10 (mg/dL)    Calcium 8.8  8.4 - 10.5 (mg/dL)    GFR calc non Af Amer >90  >90 (mL/min)    GFR calc Af Amer >90  >90 (mL/min)   HEMOGLOBIN A1C     Status: Abnormal   Collection Time   06/27/11  6:47 AM      Component Value Range Comment   Hemoglobin A1C 6.3 (*) <5.7 (%)    Mean Plasma Glucose 134 (*) <117 (mg/dL)     Dg Chest 2 View  08/24/9627  *RADIOLOGY REPORT*  Clinical Data: Chest pain.  CHEST - 2 VIEW  Comparison: 06/25/2011  Findings: Asymmetric elevation of the right hemidiaphragm noted. There is bibasilar atelectasis or infiltrate.  No substantial pleural effusion.  Cardiopericardial silhouette is at upper limits of normal for size. Imaged bony structures of  the thorax are intact.  IMPRESSION: Bibasilar atelectasis or infiltrate.  Original Report Authenticated By: ERIC A. MANSELL, M.D.   Dg Chest 2 View  06/25/2011  *RADIOLOGY REPORT*  Clinical Data: Cough, congestion, fever  CHEST - 2 VIEW  Comparison: None.  Findings: Mild perihilar and bibasilar interstitial prominence.  No focal infiltrate or overt  edema.  No effusion.  Heart size upper limits normal.  Regional bones unremarkable.  IMPRESSION:  1.  Mild central interstitial prominence, age indeterminate.  No focal infiltrate.  Original Report Authenticated By: Osa Craver, M.D.     Disposition: Discharged home in stable condition  Diet: Heart healthy diet.  Activity: Increase activity gradually.  Follow-up Appts: Patient is advised to call her primary care physician to be seen in 4-5 days from hospital discharge.  TESTS THAT NEED FOLLOW-UP Chest X Ray in 3-4 weeks from hospital discharge.  Time spent on discharge, talking to the patient, and coordinating care: 30 mins.   SignedMarcellus Scott, MD 06/27/2011, 3:19 PM

## 2011-06-27 NOTE — Progress Notes (Signed)
Pt. discharged to home. Pt after visit summary reviewed and pt capable of re verbalizing medications and follow up appointments. Pt remains stable. No signs and symptoms of distress. Educated to return to ER in the event of SOB, dizziness, chest pain, or fainting. Burley Saver, RN  Pt. discharged to home. Pt after visit summary reviewed and pt capable of re verbalizing medications and follow up appointments. Pt remains stable. No signs and symptoms of distress. Educated to return to ER in the event of SOB, dizziness, chest pain, or fainting. Burley Saver, RN

## 2011-10-07 ENCOUNTER — Ambulatory Visit
Admission: RE | Admit: 2011-10-07 | Discharge: 2011-10-07 | Disposition: A | Discharge status: Home / Self Care | Payer: 59 | Source: Ambulatory Visit | Attending: Surgery | Admitting: Surgery

## 2011-10-07 DIAGNOSIS — E042 Nontoxic multinodular goiter: Secondary | ICD-10-CM

## 2011-10-09 ENCOUNTER — Ambulatory Visit
Admission: RE | Admit: 2011-10-09 | Discharge: 2011-10-09 | Disposition: A | Discharge status: Home / Self Care | Payer: 59 | Source: Ambulatory Visit | Attending: Surgery | Admitting: Surgery

## 2011-10-09 ENCOUNTER — Telehealth (INDEPENDENT_AMBULATORY_CARE_PROVIDER_SITE_OTHER): Payer: Self-pay

## 2011-10-09 ENCOUNTER — Other Ambulatory Visit (INDEPENDENT_AMBULATORY_CARE_PROVIDER_SITE_OTHER): Payer: Self-pay

## 2011-10-09 ENCOUNTER — Other Ambulatory Visit (HOSPITAL_COMMUNITY)
Admission: RE | Admit: 2011-10-09 | Discharge: 2011-10-09 | Disposition: A | Discharge status: Home / Self Care | Payer: 59 | Source: Ambulatory Visit | Attending: Diagnostic Radiology | Admitting: Diagnostic Radiology

## 2011-10-09 ENCOUNTER — Other Ambulatory Visit (INDEPENDENT_AMBULATORY_CARE_PROVIDER_SITE_OTHER): Payer: Self-pay | Admitting: Surgery

## 2011-10-09 DIAGNOSIS — E042 Nontoxic multinodular goiter: Secondary | ICD-10-CM

## 2011-10-09 DIAGNOSIS — E041 Nontoxic single thyroid nodule: Secondary | ICD-10-CM | POA: Insufficient documentation

## 2011-10-09 NOTE — Telephone Encounter (Signed)
Copy of 2009 thyroid u/s and 2006 pathology results faxed to Tammy w/[facility].

## 2011-10-11 ENCOUNTER — Telehealth (INDEPENDENT_AMBULATORY_CARE_PROVIDER_SITE_OTHER): Payer: Self-pay

## 2011-10-11 NOTE — Telephone Encounter (Signed)
Pt notified of path result and to keep f/u appt 5-29 to discuss treatment plan.

## 2011-10-16 ENCOUNTER — Encounter (INDEPENDENT_AMBULATORY_CARE_PROVIDER_SITE_OTHER): Payer: Self-pay | Admitting: Surgery

## 2011-10-16 ENCOUNTER — Ambulatory Visit (INDEPENDENT_AMBULATORY_CARE_PROVIDER_SITE_OTHER): Payer: 59 | Admitting: Surgery

## 2011-10-16 VITALS — BP 117/71 | HR 92 | Temp 98.2°F | Ht 67.0 in | Wt 215.2 lb

## 2011-10-16 DIAGNOSIS — E039 Hypothyroidism, unspecified: Secondary | ICD-10-CM

## 2011-10-16 DIAGNOSIS — E042 Nontoxic multinodular goiter: Secondary | ICD-10-CM

## 2011-10-16 HISTORY — DX: Nontoxic multinodular goiter: E04.2

## 2011-10-16 NOTE — Progress Notes (Signed)
Visit Diagnoses: 1. Multinodular goiter (nontoxic)   2. Hypothyroidism     HISTORY: The patient is a 66 year old white female followed for several years with bilateral thyroid nodules. Patient has a known dominant nodule in the left thyroid lobe. She has multiple small nodules on the right. Patient had recently reestablished herself in this area. At my request she underwent a followup thyroid ultrasound on May 2013.  This demonstrated interval enlargement of the inferior right thyroid nodule from 11 mm to 26 mm in size. The left thyroid nodule measured 40 mm in size and contained calcifications. At my request she underwent an ultrasound-guided fine-needle aspiration biopsy of both nodules. Cytopathology results were benign. Patient is currently taking Synthroid 100 mcg daily under the direction of her primary care physician. She returns today for thyroid exam and for review of her cytopathology results.  PERTINENT REVIEW OF SYSTEMS: Denies masses. Denies pain. Denies tremor. Denies palpitations. Occasional change in the overall quality of voice.  EXAM: HEENT: normocephalic; pupils equal and reactive; sclerae clear; dentition good; mucous membranes moist NECK:  Dominant nodule left thyroid lobe, soft, mobile, nontender; right lobe nodular without discrete or dominant mass; asymmetric on extension; no palpable anterior or posterior cervical lymphadenopathy; no supraclavicular masses; no tenderness CHEST: clear to auscultation bilaterally without rales, rhonchi, or wheezes CARDIAC: regular rate and rhythm without significant murmur; peripheral pulses are full EXT:  non-tender without edema; no deformity NEURO: no gross focal deficits; no sign of tremor   IMPRESSION: #1 bilateral thyroid nodules with benign cytopathology #2 interval enlargement right inferior thyroid nodule #3 hypothyroidism #4 small goiter  PLAN: The patient and I and her husband and reviewed all the above findings and  results. I explained to them that she does have a small multinodular goiter with a dominant nodule in the left thyroid lobe. Most nodules have changed little in size over the past 5 years. The inferior right nodule has enlarged significantly but cytopathology is benign. I think it is safe to continue to monitor this thyroid. There is no absolute indication for thyroidectomy. Patient will remain on Synthroid under the direction of her primary care physician. She will return for thyroid examination in one year and we will repeat her thyroid ultrasound at that time.  Velora Heckler, MD, FACS General & Endocrine Surgery [facility]

## 2011-11-07 DIAGNOSIS — Z23 Encounter for immunization: Secondary | ICD-10-CM | POA: Diagnosis not present

## 2011-11-07 DIAGNOSIS — Z79899 Other long term (current) drug therapy: Secondary | ICD-10-CM | POA: Diagnosis not present

## 2011-11-07 DIAGNOSIS — R7301 Impaired fasting glucose: Secondary | ICD-10-CM | POA: Diagnosis not present

## 2011-11-07 DIAGNOSIS — H6120 Impacted cerumen, unspecified ear: Secondary | ICD-10-CM | POA: Diagnosis not present

## 2011-11-07 DIAGNOSIS — Z0000 Encounter for general adult medical examination without abnormal findings: Secondary | ICD-10-CM | POA: Diagnosis not present

## 2011-11-07 DIAGNOSIS — E039 Hypothyroidism, unspecified: Secondary | ICD-10-CM | POA: Diagnosis not present

## 2011-11-07 DIAGNOSIS — J45909 Unspecified asthma, uncomplicated: Secondary | ICD-10-CM | POA: Diagnosis not present

## 2011-11-07 DIAGNOSIS — E559 Vitamin D deficiency, unspecified: Secondary | ICD-10-CM | POA: Diagnosis not present

## 2011-11-07 DIAGNOSIS — E785 Hyperlipidemia, unspecified: Secondary | ICD-10-CM | POA: Diagnosis not present

## 2012-01-11 DIAGNOSIS — Z23 Encounter for immunization: Secondary | ICD-10-CM | POA: Diagnosis not present

## 2012-01-21 ENCOUNTER — Other Ambulatory Visit: Payer: Self-pay | Admitting: Family Medicine

## 2012-01-21 DIAGNOSIS — Z1231 Encounter for screening mammogram for malignant neoplasm of breast: Secondary | ICD-10-CM

## 2012-03-17 ENCOUNTER — Ambulatory Visit
Admission: RE | Admit: 2012-03-17 | Discharge: 2012-03-17 | Disposition: A | Discharge status: Home / Self Care | Payer: 59 | Source: Ambulatory Visit | Attending: Family Medicine | Admitting: Family Medicine

## 2012-03-17 DIAGNOSIS — Z1231 Encounter for screening mammogram for malignant neoplasm of breast: Secondary | ICD-10-CM | POA: Diagnosis not present

## 2012-03-19 DIAGNOSIS — H40019 Open angle with borderline findings, low risk, unspecified eye: Secondary | ICD-10-CM | POA: Diagnosis not present

## 2012-06-11 DIAGNOSIS — M25579 Pain in unspecified ankle and joints of unspecified foot: Secondary | ICD-10-CM | POA: Diagnosis not present

## 2012-06-11 DIAGNOSIS — M109 Gout, unspecified: Secondary | ICD-10-CM | POA: Diagnosis not present

## 2012-06-11 DIAGNOSIS — M2010 Hallux valgus (acquired), unspecified foot: Secondary | ICD-10-CM | POA: Diagnosis not present

## 2012-06-11 DIAGNOSIS — M7150 Other bursitis, not elsewhere classified, unspecified site: Secondary | ICD-10-CM | POA: Diagnosis not present

## 2012-06-11 DIAGNOSIS — M79609 Pain in unspecified limb: Secondary | ICD-10-CM | POA: Diagnosis not present

## 2012-06-18 DIAGNOSIS — M109 Gout, unspecified: Secondary | ICD-10-CM | POA: Diagnosis not present

## 2012-06-18 DIAGNOSIS — M7150 Other bursitis, not elsewhere classified, unspecified site: Secondary | ICD-10-CM | POA: Diagnosis not present

## 2012-07-22 DIAGNOSIS — M2010 Hallux valgus (acquired), unspecified foot: Secondary | ICD-10-CM | POA: Diagnosis not present

## 2012-07-22 DIAGNOSIS — M79609 Pain in unspecified limb: Secondary | ICD-10-CM | POA: Diagnosis not present

## 2012-07-22 DIAGNOSIS — Z01818 Encounter for other preprocedural examination: Secondary | ICD-10-CM | POA: Diagnosis not present

## 2012-07-28 DIAGNOSIS — M25579 Pain in unspecified ankle and joints of unspecified foot: Secondary | ICD-10-CM | POA: Diagnosis not present

## 2012-07-28 DIAGNOSIS — I10 Essential (primary) hypertension: Secondary | ICD-10-CM | POA: Diagnosis not present

## 2012-07-28 DIAGNOSIS — M2010 Hallux valgus (acquired), unspecified foot: Secondary | ICD-10-CM | POA: Diagnosis not present

## 2012-07-31 DIAGNOSIS — M25579 Pain in unspecified ankle and joints of unspecified foot: Secondary | ICD-10-CM | POA: Diagnosis not present

## 2012-08-10 DIAGNOSIS — M25579 Pain in unspecified ankle and joints of unspecified foot: Secondary | ICD-10-CM | POA: Diagnosis not present

## 2012-08-12 DIAGNOSIS — J45909 Unspecified asthma, uncomplicated: Secondary | ICD-10-CM | POA: Diagnosis not present

## 2012-08-12 DIAGNOSIS — Z79899 Other long term (current) drug therapy: Secondary | ICD-10-CM | POA: Diagnosis not present

## 2012-08-12 DIAGNOSIS — Z0000 Encounter for general adult medical examination without abnormal findings: Secondary | ICD-10-CM | POA: Diagnosis not present

## 2012-08-12 DIAGNOSIS — E039 Hypothyroidism, unspecified: Secondary | ICD-10-CM | POA: Diagnosis not present

## 2012-08-12 DIAGNOSIS — E785 Hyperlipidemia, unspecified: Secondary | ICD-10-CM | POA: Diagnosis not present

## 2012-08-12 DIAGNOSIS — E559 Vitamin D deficiency, unspecified: Secondary | ICD-10-CM | POA: Diagnosis not present

## 2012-08-12 DIAGNOSIS — R7301 Impaired fasting glucose: Secondary | ICD-10-CM | POA: Diagnosis not present

## 2012-08-24 DIAGNOSIS — M25579 Pain in unspecified ankle and joints of unspecified foot: Secondary | ICD-10-CM | POA: Diagnosis not present

## 2012-09-01 DIAGNOSIS — M722 Plantar fascial fibromatosis: Secondary | ICD-10-CM | POA: Diagnosis not present

## 2012-09-01 DIAGNOSIS — M7150 Other bursitis, not elsewhere classified, unspecified site: Secondary | ICD-10-CM | POA: Diagnosis not present

## 2012-09-01 DIAGNOSIS — N289 Disorder of kidney and ureter, unspecified: Secondary | ICD-10-CM | POA: Diagnosis not present

## 2012-09-01 DIAGNOSIS — M25579 Pain in unspecified ankle and joints of unspecified foot: Secondary | ICD-10-CM | POA: Diagnosis not present

## 2012-09-01 DIAGNOSIS — M12279 Villonodular synovitis (pigmented), unspecified ankle and foot: Secondary | ICD-10-CM | POA: Diagnosis not present

## 2012-09-01 DIAGNOSIS — M79609 Pain in unspecified limb: Secondary | ICD-10-CM | POA: Diagnosis not present

## 2012-09-01 DIAGNOSIS — R269 Unspecified abnormalities of gait and mobility: Secondary | ICD-10-CM | POA: Diagnosis not present

## 2012-09-01 DIAGNOSIS — M2010 Hallux valgus (acquired), unspecified foot: Secondary | ICD-10-CM | POA: Diagnosis not present

## 2012-09-09 DIAGNOSIS — N289 Disorder of kidney and ureter, unspecified: Secondary | ICD-10-CM | POA: Diagnosis not present

## 2012-09-09 DIAGNOSIS — M25579 Pain in unspecified ankle and joints of unspecified foot: Secondary | ICD-10-CM | POA: Diagnosis not present

## 2012-09-09 DIAGNOSIS — M2010 Hallux valgus (acquired), unspecified foot: Secondary | ICD-10-CM | POA: Diagnosis not present

## 2012-09-09 DIAGNOSIS — M79609 Pain in unspecified limb: Secondary | ICD-10-CM | POA: Diagnosis not present

## 2012-09-09 DIAGNOSIS — M12279 Villonodular synovitis (pigmented), unspecified ankle and foot: Secondary | ICD-10-CM | POA: Diagnosis not present

## 2012-09-09 DIAGNOSIS — M722 Plantar fascial fibromatosis: Secondary | ICD-10-CM | POA: Diagnosis not present

## 2012-09-11 DIAGNOSIS — M12279 Villonodular synovitis (pigmented), unspecified ankle and foot: Secondary | ICD-10-CM | POA: Diagnosis not present

## 2012-09-11 DIAGNOSIS — M722 Plantar fascial fibromatosis: Secondary | ICD-10-CM | POA: Diagnosis not present

## 2012-09-11 DIAGNOSIS — M79609 Pain in unspecified limb: Secondary | ICD-10-CM | POA: Diagnosis not present

## 2012-09-11 DIAGNOSIS — M25579 Pain in unspecified ankle and joints of unspecified foot: Secondary | ICD-10-CM | POA: Diagnosis not present

## 2012-09-11 DIAGNOSIS — N289 Disorder of kidney and ureter, unspecified: Secondary | ICD-10-CM | POA: Diagnosis not present

## 2012-09-11 DIAGNOSIS — M2010 Hallux valgus (acquired), unspecified foot: Secondary | ICD-10-CM | POA: Diagnosis not present

## 2012-09-16 DIAGNOSIS — M12279 Villonodular synovitis (pigmented), unspecified ankle and foot: Secondary | ICD-10-CM | POA: Diagnosis not present

## 2012-09-16 DIAGNOSIS — M25579 Pain in unspecified ankle and joints of unspecified foot: Secondary | ICD-10-CM | POA: Diagnosis not present

## 2012-09-16 DIAGNOSIS — M79609 Pain in unspecified limb: Secondary | ICD-10-CM | POA: Diagnosis not present

## 2012-09-16 DIAGNOSIS — N289 Disorder of kidney and ureter, unspecified: Secondary | ICD-10-CM | POA: Diagnosis not present

## 2012-09-16 DIAGNOSIS — M2010 Hallux valgus (acquired), unspecified foot: Secondary | ICD-10-CM | POA: Diagnosis not present

## 2012-09-16 DIAGNOSIS — M722 Plantar fascial fibromatosis: Secondary | ICD-10-CM | POA: Diagnosis not present

## 2012-09-17 ENCOUNTER — Telehealth (INDEPENDENT_AMBULATORY_CARE_PROVIDER_SITE_OTHER): Payer: Self-pay | Admitting: General Surgery

## 2012-09-17 NOTE — Telephone Encounter (Signed)
Spoke with patient she is aware of  [place] at [employer] . May 2014 and was instructed to  Call office for follow up visit with Dr Gerrit Friends when she makes her appt . Mailed to patient

## 2012-09-18 DIAGNOSIS — M79609 Pain in unspecified limb: Secondary | ICD-10-CM | POA: Diagnosis not present

## 2012-09-18 DIAGNOSIS — M7150 Other bursitis, not elsewhere classified, unspecified site: Secondary | ICD-10-CM | POA: Diagnosis not present

## 2012-09-18 DIAGNOSIS — R269 Unspecified abnormalities of gait and mobility: Secondary | ICD-10-CM | POA: Diagnosis not present

## 2012-09-18 DIAGNOSIS — M25579 Pain in unspecified ankle and joints of unspecified foot: Secondary | ICD-10-CM | POA: Diagnosis not present

## 2012-09-18 DIAGNOSIS — M2010 Hallux valgus (acquired), unspecified foot: Secondary | ICD-10-CM | POA: Diagnosis not present

## 2012-09-18 DIAGNOSIS — M722 Plantar fascial fibromatosis: Secondary | ICD-10-CM | POA: Diagnosis not present

## 2012-09-18 DIAGNOSIS — M12279 Villonodular synovitis (pigmented), unspecified ankle and foot: Secondary | ICD-10-CM | POA: Diagnosis not present

## 2012-09-18 DIAGNOSIS — N289 Disorder of kidney and ureter, unspecified: Secondary | ICD-10-CM | POA: Diagnosis not present

## 2012-09-21 DIAGNOSIS — M25579 Pain in unspecified ankle and joints of unspecified foot: Secondary | ICD-10-CM | POA: Diagnosis not present

## 2012-09-22 DIAGNOSIS — M2010 Hallux valgus (acquired), unspecified foot: Secondary | ICD-10-CM | POA: Diagnosis not present

## 2012-09-22 DIAGNOSIS — M12279 Villonodular synovitis (pigmented), unspecified ankle and foot: Secondary | ICD-10-CM | POA: Diagnosis not present

## 2012-09-22 DIAGNOSIS — M79609 Pain in unspecified limb: Secondary | ICD-10-CM | POA: Diagnosis not present

## 2012-09-22 DIAGNOSIS — N289 Disorder of kidney and ureter, unspecified: Secondary | ICD-10-CM | POA: Diagnosis not present

## 2012-09-22 DIAGNOSIS — M25579 Pain in unspecified ankle and joints of unspecified foot: Secondary | ICD-10-CM | POA: Diagnosis not present

## 2012-09-22 DIAGNOSIS — M722 Plantar fascial fibromatosis: Secondary | ICD-10-CM | POA: Diagnosis not present

## 2012-09-23 ENCOUNTER — Ambulatory Visit
Admission: RE | Admit: 2012-09-23 | Discharge: 2012-09-23 | Disposition: A | Discharge status: Home / Self Care | Payer: Medicare Other | Source: Ambulatory Visit | Attending: Surgery | Admitting: Surgery

## 2012-09-23 DIAGNOSIS — E042 Nontoxic multinodular goiter: Secondary | ICD-10-CM | POA: Diagnosis not present

## 2012-09-24 DIAGNOSIS — M79609 Pain in unspecified limb: Secondary | ICD-10-CM | POA: Diagnosis not present

## 2012-09-24 DIAGNOSIS — M722 Plantar fascial fibromatosis: Secondary | ICD-10-CM | POA: Diagnosis not present

## 2012-09-24 DIAGNOSIS — M2010 Hallux valgus (acquired), unspecified foot: Secondary | ICD-10-CM | POA: Diagnosis not present

## 2012-09-24 DIAGNOSIS — M12279 Villonodular synovitis (pigmented), unspecified ankle and foot: Secondary | ICD-10-CM | POA: Diagnosis not present

## 2012-09-24 DIAGNOSIS — M25579 Pain in unspecified ankle and joints of unspecified foot: Secondary | ICD-10-CM | POA: Diagnosis not present

## 2012-09-24 DIAGNOSIS — N289 Disorder of kidney and ureter, unspecified: Secondary | ICD-10-CM | POA: Diagnosis not present

## 2012-09-25 ENCOUNTER — Telehealth (INDEPENDENT_AMBULATORY_CARE_PROVIDER_SITE_OTHER): Payer: Self-pay

## 2012-09-25 NOTE — Telephone Encounter (Signed)
U/S result here to [facility] for review. Pt has appt 10-29-12. Awaiting response from Dr Gerrit Friends to see if pt needs bx prior to ov.

## 2012-09-28 DIAGNOSIS — J329 Chronic sinusitis, unspecified: Secondary | ICD-10-CM | POA: Diagnosis not present

## 2012-09-28 DIAGNOSIS — J309 Allergic rhinitis, unspecified: Secondary | ICD-10-CM | POA: Diagnosis not present

## 2012-09-29 DIAGNOSIS — M2010 Hallux valgus (acquired), unspecified foot: Secondary | ICD-10-CM | POA: Diagnosis not present

## 2012-09-29 DIAGNOSIS — M79609 Pain in unspecified limb: Secondary | ICD-10-CM | POA: Diagnosis not present

## 2012-09-29 DIAGNOSIS — N289 Disorder of kidney and ureter, unspecified: Secondary | ICD-10-CM | POA: Diagnosis not present

## 2012-09-29 DIAGNOSIS — M25579 Pain in unspecified ankle and joints of unspecified foot: Secondary | ICD-10-CM | POA: Diagnosis not present

## 2012-09-29 DIAGNOSIS — M722 Plantar fascial fibromatosis: Secondary | ICD-10-CM | POA: Diagnosis not present

## 2012-09-29 DIAGNOSIS — M12279 Villonodular synovitis (pigmented), unspecified ankle and foot: Secondary | ICD-10-CM | POA: Diagnosis not present

## 2012-09-30 ENCOUNTER — Telehealth (INDEPENDENT_AMBULATORY_CARE_PROVIDER_SITE_OTHER): Payer: Self-pay

## 2012-09-30 NOTE — Telephone Encounter (Signed)
Message copied by Joanette Gula on Wed Sep 30, 2012  9:40 AM ------      Message from: Velora Heckler      Created: Tue Sep 29, 2012  4:12 PM       Cindy:            Schedule ultrasound guided FNA biopsy of new 1.4 cm nodule in left upper pole with radiology.  Go ahead and get this done before her office visit with me on 6/12.            Thanks,            tmg ------

## 2012-10-01 ENCOUNTER — Telehealth (INDEPENDENT_AMBULATORY_CARE_PROVIDER_SITE_OTHER): Payer: Self-pay | Admitting: General Surgery

## 2012-10-01 ENCOUNTER — Other Ambulatory Visit (INDEPENDENT_AMBULATORY_CARE_PROVIDER_SITE_OTHER): Payer: Self-pay

## 2012-10-01 DIAGNOSIS — M79609 Pain in unspecified limb: Secondary | ICD-10-CM | POA: Diagnosis not present

## 2012-10-01 DIAGNOSIS — N289 Disorder of kidney and ureter, unspecified: Secondary | ICD-10-CM | POA: Diagnosis not present

## 2012-10-01 DIAGNOSIS — M2010 Hallux valgus (acquired), unspecified foot: Secondary | ICD-10-CM | POA: Diagnosis not present

## 2012-10-01 DIAGNOSIS — E041 Nontoxic single thyroid nodule: Secondary | ICD-10-CM

## 2012-10-01 DIAGNOSIS — M12279 Villonodular synovitis (pigmented), unspecified ankle and foot: Secondary | ICD-10-CM | POA: Diagnosis not present

## 2012-10-01 DIAGNOSIS — M722 Plantar fascial fibromatosis: Secondary | ICD-10-CM | POA: Diagnosis not present

## 2012-10-01 DIAGNOSIS — M25579 Pain in unspecified ankle and joints of unspecified foot: Secondary | ICD-10-CM | POA: Diagnosis not present

## 2012-10-01 NOTE — Telephone Encounter (Signed)
Left message for patient to call office back  Tammy will be calling her as well for thyroid BX  Day and time

## 2012-10-01 NOTE — Telephone Encounter (Signed)
Spoke with patient she is aware of BX and po f/u visit

## 2012-10-07 ENCOUNTER — Ambulatory Visit
Admission: RE | Admit: 2012-10-07 | Discharge: 2012-10-07 | Disposition: A | Discharge status: Home / Self Care | Payer: Medicare Other | Source: Ambulatory Visit | Attending: Surgery | Admitting: Surgery

## 2012-10-07 ENCOUNTER — Other Ambulatory Visit (INDEPENDENT_AMBULATORY_CARE_PROVIDER_SITE_OTHER): Payer: Self-pay | Admitting: Surgery

## 2012-10-07 DIAGNOSIS — E041 Nontoxic single thyroid nodule: Secondary | ICD-10-CM | POA: Diagnosis not present

## 2012-10-29 ENCOUNTER — Other Ambulatory Visit (INDEPENDENT_AMBULATORY_CARE_PROVIDER_SITE_OTHER): Payer: Self-pay | Admitting: Surgery

## 2012-10-29 ENCOUNTER — Encounter (INDEPENDENT_AMBULATORY_CARE_PROVIDER_SITE_OTHER): Payer: Self-pay | Admitting: Surgery

## 2012-10-29 ENCOUNTER — Ambulatory Visit (INDEPENDENT_AMBULATORY_CARE_PROVIDER_SITE_OTHER): Payer: Medicare Other | Admitting: Surgery

## 2012-10-29 VITALS — BP 150/80 | HR 74 | Temp 98.0°F | Resp 14 | Ht 67.0 in | Wt 237.2 lb

## 2012-10-29 DIAGNOSIS — E042 Nontoxic multinodular goiter: Secondary | ICD-10-CM

## 2012-10-29 LAB — TSH: TSH: 0.379 u[IU]/mL (ref 0.350–4.500)

## 2012-10-29 NOTE — Progress Notes (Signed)
General Surgery [facility]  Visit Diagnoses: 1. Multinodular goiter (nontoxic)     HISTORY: Patient is a 67 year old female followed for bilateral thyroid nodules. At my request a repeat thyroid ultrasound was requested and performed on 09/23/2012. This showed that the bilateral thyroid nodules were largely stable in size compared to her prior study of May 2013. However there was a suspicion of a new nodule in the upper left lobe measuring 1.4 cm. Therefore the patient was scheduled for ultrasound-guided fine-needle aspiration biopsy. On 10/07/2012 a repeat examination failed to reveal any new lesion in the left lobe and the biopsy was canceled. Patient now presents for follow-up and physical examination.  PERTINENT REVIEW OF SYSTEMS: Denies tremor. Denies palpitation. Occasional globus sensation. Occasional moderate fatigue  EXAM: HEENT: normocephalic; pupils equal and reactive; sclerae clear; dentition good; mucous membranes moist NECK:  Palpable dominant nodule in inferior left thyroid lobe, approximately 3 cm in size, smooth, mobile with swallowing; right thyroid lobe with some nodularity but no dominant nor discrete mass; asymmetric on extension; no palpable anterior or posterior cervical lymphadenopathy; no supraclavicular masses; no tenderness CHEST: clear to auscultation bilaterally without rales, rhonchi, or wheezes CARDIAC: regular rate and rhythm without significant murmur; peripheral pulses are full EXT:  non-tender without edema; no deformity NEURO: no gross focal deficits; no sign of tremor   IMPRESSION: Multinodular thyroid goiter, stable on clinical exam and by sequential ultrasound scanning  PLAN: Patient and I reviewed the above studies. We reviewed the study that questioned a possible left upper pole nodule. Given these results, I would like to repeat her thyroid ultrasound in one year. She remains on Synthroid 100 mcg daily. Because of her recent  fatigue, we will check a TSH level. I will forward these results to her primary care physician as well.  Patient will return in one year for physical examination and review of her repeat ultrasound examination.  Velora Heckler, MD, [facility] Office: [phone]

## 2012-10-29 NOTE — Patient Instructions (Signed)

## 2012-10-29 NOTE — Addendum Note (Signed)
Addended by: Joanette Gula on: 10/29/2012 04:20 PM   Modules accepted: Orders

## 2012-11-02 ENCOUNTER — Telehealth (INDEPENDENT_AMBULATORY_CARE_PROVIDER_SITE_OTHER): Payer: Self-pay

## 2012-11-02 NOTE — Telephone Encounter (Signed)
TSH 0.379 here and to Dr Gerrit Friends to review and advise.

## 2012-11-04 ENCOUNTER — Telehealth (INDEPENDENT_AMBULATORY_CARE_PROVIDER_SITE_OTHER): Payer: Self-pay

## 2012-11-04 NOTE — Telephone Encounter (Signed)
Per Dr Ardine Eng request pt notified of lab result and to stay on current dose of synthroid 100 mcg daily. Pt states she understands.

## 2012-11-04 NOTE — Telephone Encounter (Signed)
Message copied by Joanette Gula on Wed Nov 04, 2012  9:56 AM ------      Message from: Velora Heckler      Created: Tue Nov 03, 2012  2:36 PM       On Synthroid 100 mcg daily.  No need to change dosage.  Please let patient know.            Velora Heckler, MD, [facility]      Office: [phone]             ------

## 2012-11-10 NOTE — Telephone Encounter (Signed)
TSH level at bottom of normal range.  No need for increase in dosage.  May need to consider decreasing dosage if TSH becomes more suppressed.  Will repeat TSH before next appointment in one year.  Arline Asp - please notify patient and arrange for TSH level in one year before next office visit.  Velora Heckler, MD, [facility] Office: [phone]

## 2012-12-23 ENCOUNTER — Other Ambulatory Visit: Payer: Self-pay

## 2013-01-22 DIAGNOSIS — E559 Vitamin D deficiency, unspecified: Secondary | ICD-10-CM | POA: Diagnosis not present

## 2013-01-22 DIAGNOSIS — R7301 Impaired fasting glucose: Secondary | ICD-10-CM | POA: Diagnosis not present

## 2013-01-22 DIAGNOSIS — J45909 Unspecified asthma, uncomplicated: Secondary | ICD-10-CM | POA: Diagnosis not present

## 2013-01-22 DIAGNOSIS — Z1331 Encounter for screening for depression: Secondary | ICD-10-CM | POA: Diagnosis not present

## 2013-01-22 DIAGNOSIS — Z0000 Encounter for general adult medical examination without abnormal findings: Secondary | ICD-10-CM | POA: Diagnosis not present

## 2013-01-22 DIAGNOSIS — Z79899 Other long term (current) drug therapy: Secondary | ICD-10-CM | POA: Diagnosis not present

## 2013-01-22 DIAGNOSIS — E039 Hypothyroidism, unspecified: Secondary | ICD-10-CM | POA: Diagnosis not present

## 2013-01-22 DIAGNOSIS — E785 Hyperlipidemia, unspecified: Secondary | ICD-10-CM | POA: Diagnosis not present

## 2013-01-24 DIAGNOSIS — Z23 Encounter for immunization: Secondary | ICD-10-CM | POA: Diagnosis not present

## 2013-02-17 ENCOUNTER — Other Ambulatory Visit: Payer: Self-pay

## 2013-02-17 DIAGNOSIS — Z1231 Encounter for screening mammogram for malignant neoplasm of breast: Secondary | ICD-10-CM

## 2013-03-18 ENCOUNTER — Ambulatory Visit
Admission: RE | Admit: 2013-03-18 | Discharge: 2013-03-18 | Disposition: A | Discharge status: Home / Self Care | Payer: Medicare Other | Source: Ambulatory Visit

## 2013-03-18 DIAGNOSIS — Z1231 Encounter for screening mammogram for malignant neoplasm of breast: Secondary | ICD-10-CM

## 2013-03-25 ENCOUNTER — Other Ambulatory Visit: Payer: Self-pay

## 2013-06-09 DIAGNOSIS — J45901 Unspecified asthma with (acute) exacerbation: Secondary | ICD-10-CM | POA: Diagnosis not present

## 2013-06-09 DIAGNOSIS — R0602 Shortness of breath: Secondary | ICD-10-CM | POA: Diagnosis not present

## 2013-07-04 DIAGNOSIS — J45909 Unspecified asthma, uncomplicated: Secondary | ICD-10-CM | POA: Diagnosis not present

## 2013-07-09 DIAGNOSIS — J45909 Unspecified asthma, uncomplicated: Secondary | ICD-10-CM | POA: Diagnosis not present

## 2013-07-09 DIAGNOSIS — R7301 Impaired fasting glucose: Secondary | ICD-10-CM | POA: Diagnosis not present

## 2013-08-11 DIAGNOSIS — Z23 Encounter for immunization: Secondary | ICD-10-CM | POA: Diagnosis not present

## 2013-08-11 DIAGNOSIS — J45909 Unspecified asthma, uncomplicated: Secondary | ICD-10-CM | POA: Diagnosis not present

## 2013-08-11 DIAGNOSIS — R7301 Impaired fasting glucose: Secondary | ICD-10-CM | POA: Diagnosis not present

## 2013-10-29 ENCOUNTER — Ambulatory Visit
Admission: RE | Admit: 2013-10-29 | Discharge: 2013-10-29 | Disposition: A | Discharge status: Home / Self Care | Payer: Medicare Other | Source: Ambulatory Visit | Attending: Surgery | Admitting: Surgery

## 2013-10-29 DIAGNOSIS — E041 Nontoxic single thyroid nodule: Secondary | ICD-10-CM | POA: Diagnosis not present

## 2013-10-29 DIAGNOSIS — E042 Nontoxic multinodular goiter: Secondary | ICD-10-CM

## 2013-11-10 ENCOUNTER — Other Ambulatory Visit (INDEPENDENT_AMBULATORY_CARE_PROVIDER_SITE_OTHER): Payer: Self-pay

## 2013-11-10 ENCOUNTER — Telehealth (INDEPENDENT_AMBULATORY_CARE_PROVIDER_SITE_OTHER): Payer: Self-pay

## 2013-11-10 DIAGNOSIS — E042 Nontoxic multinodular goiter: Secondary | ICD-10-CM | POA: Diagnosis not present

## 2013-11-10 NOTE — Telephone Encounter (Signed)
Pt advised per Dr Gala Lewandowsky last note TSH due prior to appt. Pt will go to lab next week.

## 2013-11-12 LAB — TSH: TSH: 0.794 u[IU]/mL (ref 0.350–4.500)

## 2013-11-22 ENCOUNTER — Encounter (INDEPENDENT_AMBULATORY_CARE_PROVIDER_SITE_OTHER): Payer: Self-pay | Admitting: Surgery

## 2013-11-22 ENCOUNTER — Ambulatory Visit (INDEPENDENT_AMBULATORY_CARE_PROVIDER_SITE_OTHER): Payer: Medicare Other | Admitting: Surgery

## 2013-11-22 VITALS — BP 125/80 | HR 76 | Temp 98.6°F | Resp 18 | Ht 67.0 in | Wt 235.0 lb

## 2013-11-22 DIAGNOSIS — E039 Hypothyroidism, unspecified: Secondary | ICD-10-CM

## 2013-11-22 DIAGNOSIS — E042 Nontoxic multinodular goiter: Secondary | ICD-10-CM | POA: Diagnosis not present

## 2013-11-22 NOTE — Progress Notes (Signed)
General Surgery [facility]  Chief Complaint  Patient presents with  . Follow-up    bilateral thyroid nodules    HISTORY: Patient is a 68 year old female followed for bilateral thyroid nodules. She is on Synthroid 100 mcg daily. TSH level is normal at 0.794.  At my request she had a thyroid ultrasound performed on 10/29/2013. This was compared to a prior study from May 2014. The thyroid gland is slightly enlarged. Dominant nodule in the right measures 3.0 cm which is minimally changed from the prior year. Dominant nodule in the left measures 4.0 cm and is unchanged. Previous biopsies have shown benign cytopathology.  PERTINENT REVIEW OF SYSTEMS: Denies compressive symptoms. Denies tremor. Denies palpitations. Complains of sinus drainage and congestion.  EXAM: HEENT: normocephalic; pupils equal and reactive; sclerae clear; dentition good; mucous membranes moist NECK:  Palpable dominant nodule left thyroid lobe, approximately 4 cm, soft, mobile, nontender; right thyroid lobe with smaller smooth nodule more deeply situated in the right lobe; asymmetric on extension; no palpable anterior or posterior cervical lymphadenopathy; no supraclavicular masses; no tenderness CHEST: clear to auscultation bilaterally without rales, rhonchi, or wheezes CARDIAC: regular rate and rhythm without significant murmur; peripheral pulses are full EXT:  non-tender without edema; no deformity NEURO: no gross focal deficits; no sign of tremor   IMPRESSION: #1 dominant left thyroid nodule, 4.0 cm, clinically stable #2 dominant right thyroid nodule, 3.0 cm, minimally enlarged #3 hypothyroidism  PLAN: Patient and I reviewed her ultrasound report in detail. I think the changes are minimal and not clinically significant. I have recommended a followup ultrasound in one year.  Patient will remain on Synthroid 100 mcg daily. Her primary care physician we'll continue to monitor her  dosage.  Patient will return in 1 year for physical examination and review of her ultrasound results.  [name], MD, [facility] Office: [phone]  Visit Diagnoses: 1. Multinodular goiter (nontoxic)   2. Hypothyroidism, unspecified hypothyroidism type

## 2013-11-22 NOTE — Patient Instructions (Signed)
Thyroid-Stimulating Hormone The amount of thyroid-stimulating hormone (TSH) or thyrotropin can be measured from a sample of blood. The TSH level can help diagnose thyroid gland or pituitary gland disorders, or monitor treatment of hypothyroidism and hyperthyroidism. TSH is produced by the pituitary gland, a tiny organ located below the brain. The pituitary gland is part of the body's feedback system to maintain stable levels of thyroid hormones released into the blood. Thyroid hormones help control the rate at which the body uses energy. The pituitary gland monitors the level of thyroid hormones released by the thyroid gland. The thyroid gland is a small butterfly-shaped gland that lies flat against the windpipe. If the thyroid gland does not release enough thyroid hormones, the pituitary gland detects the reduced thyroid hormone levels. The pituitary gland then makes more TSH to trigger the thyroid gland to produce more thyroid hormones. This increase in TSH is an effort to return the low thyroid hormones to normal levels. The increased TSH level is caused by the low thyroid hormone levels of an underactive thyroid (hypothyroidism). Symptoms of hypothyroidism include menstrual irregularities in women, weight gain, dry skin, constipation, cold intolerance, and fatigue. Rarely, a high TSH level can indicate a problem with the pituitary gland. A high TSH level could also occur when there is an insufficient level of thyroid hormone medication in individuals receiving that medication. If the thyroid gland releases too much thyroid hormones, the pituitary gland detects the increased thyroid hormone levels. The pituitary gland then makes less TSH to slow the thyroid gland from producing thyroid hormones. This decrease in TSH is an effort to return the increased thyroid hormones to normal levels. The decreased TSH level is caused by the excess thyroid hormone levels of an overactive thyroid gland (hyperthyroidism).  Symptoms associated with hyperthyroidism include rapid heart rate, weight loss, nervousness, hand tremors, irritated eyes, and difficulty sleeping. Rarely, a low TSH level can indicate a problem with the pituitary gland. PREPARATION FOR TEST No specific preparation is required for this blood test. A blood sample is obtained from a needle placed in a vein in your arm or from pricking the heel of an infant.  NORMAL FINDINGS  Adult: 0.5-5 milli-international Units/L (0.5-5 mIU/L)  Newborn: 3-20 milli-international Units/L (3-20 mIU/L)  Cord: 3-12 milli-international Units/L (3-12 mIU/L) Ranges for normal findings may vary among different laboratories and hospitals. You should always check with your doctor after having lab work or other tests done to discuss the meaning of your test results and whether your values are considered within normal limits. MEANING OF TEST  Your caregiver will go over the test results with you and discuss the importance and meaning of your results, as well as treatment options and the need for additional tests if necessary. OBTAINING THE TEST RESULTS It is your responsibility to obtain your test results. Ask the lab or department performing the test when and how you will get your results. Document Released: 05/31/2004 Document Revised: 07/29/2011 Document Reviewed: 04/17/2008 [facility] Patient Information 2015 [place], [place]. This information is not intended to replace advice given to you by your health care provider. Make sure you discuss any questions you have with your health care provider.

## 2013-12-02 ENCOUNTER — Other Ambulatory Visit (INDEPENDENT_AMBULATORY_CARE_PROVIDER_SITE_OTHER): Payer: Self-pay

## 2013-12-02 DIAGNOSIS — E042 Nontoxic multinodular goiter: Secondary | ICD-10-CM

## 2014-02-02 DIAGNOSIS — Z0000 Encounter for general adult medical examination without abnormal findings: Secondary | ICD-10-CM | POA: Diagnosis not present

## 2014-02-02 DIAGNOSIS — E041 Nontoxic single thyroid nodule: Secondary | ICD-10-CM | POA: Diagnosis not present

## 2014-02-02 DIAGNOSIS — Z79899 Other long term (current) drug therapy: Secondary | ICD-10-CM | POA: Diagnosis not present

## 2014-02-02 DIAGNOSIS — E039 Hypothyroidism, unspecified: Secondary | ICD-10-CM | POA: Diagnosis not present

## 2014-02-02 DIAGNOSIS — J45909 Unspecified asthma, uncomplicated: Secondary | ICD-10-CM | POA: Diagnosis not present

## 2014-02-02 DIAGNOSIS — E559 Vitamin D deficiency, unspecified: Secondary | ICD-10-CM | POA: Diagnosis not present

## 2014-02-02 DIAGNOSIS — Z23 Encounter for immunization: Secondary | ICD-10-CM | POA: Diagnosis not present

## 2014-02-02 DIAGNOSIS — R7301 Impaired fasting glucose: Secondary | ICD-10-CM | POA: Diagnosis not present

## 2014-02-02 DIAGNOSIS — E785 Hyperlipidemia, unspecified: Secondary | ICD-10-CM | POA: Diagnosis not present

## 2014-02-16 ENCOUNTER — Other Ambulatory Visit: Payer: Self-pay | Admitting: Family Medicine

## 2014-02-16 DIAGNOSIS — R1013 Epigastric pain: Secondary | ICD-10-CM

## 2014-02-23 ENCOUNTER — Other Ambulatory Visit: Payer: Self-pay

## 2014-02-23 DIAGNOSIS — Z1231 Encounter for screening mammogram for malignant neoplasm of breast: Secondary | ICD-10-CM

## 2014-02-25 ENCOUNTER — Ambulatory Visit
Admission: RE | Admit: 2014-02-25 | Discharge: 2014-02-25 | Disposition: A | Discharge status: Home / Self Care | Payer: Medicare Other | Source: Ambulatory Visit | Attending: Family Medicine | Admitting: Family Medicine

## 2014-02-25 DIAGNOSIS — K760 Fatty (change of) liver, not elsewhere classified: Secondary | ICD-10-CM | POA: Diagnosis not present

## 2014-02-25 DIAGNOSIS — R1013 Epigastric pain: Secondary | ICD-10-CM

## 2014-02-25 DIAGNOSIS — K8020 Calculus of gallbladder without cholecystitis without obstruction: Secondary | ICD-10-CM | POA: Diagnosis not present

## 2014-03-04 DIAGNOSIS — H40013 Open angle with borderline findings, low risk, bilateral: Secondary | ICD-10-CM | POA: Diagnosis not present

## 2014-03-04 DIAGNOSIS — H04123 Dry eye syndrome of bilateral lacrimal glands: Secondary | ICD-10-CM | POA: Diagnosis not present

## 2014-03-08 DIAGNOSIS — L814 Other melanin hyperpigmentation: Secondary | ICD-10-CM | POA: Diagnosis not present

## 2014-03-08 DIAGNOSIS — L304 Erythema intertrigo: Secondary | ICD-10-CM | POA: Diagnosis not present

## 2014-03-08 DIAGNOSIS — L821 Other seborrheic keratosis: Secondary | ICD-10-CM | POA: Diagnosis not present

## 2014-03-08 DIAGNOSIS — L570 Actinic keratosis: Secondary | ICD-10-CM | POA: Diagnosis not present

## 2014-03-08 DIAGNOSIS — Z808 Family history of malignant neoplasm of other organs or systems: Secondary | ICD-10-CM | POA: Diagnosis not present

## 2014-03-08 DIAGNOSIS — D239 Other benign neoplasm of skin, unspecified: Secondary | ICD-10-CM | POA: Diagnosis not present

## 2014-03-08 DIAGNOSIS — D1801 Hemangioma of skin and subcutaneous tissue: Secondary | ICD-10-CM | POA: Diagnosis not present

## 2014-03-08 DIAGNOSIS — L820 Inflamed seborrheic keratosis: Secondary | ICD-10-CM | POA: Diagnosis not present

## 2014-03-21 ENCOUNTER — Ambulatory Visit (INDEPENDENT_AMBULATORY_CARE_PROVIDER_SITE_OTHER): Payer: Self-pay | Admitting: Surgery

## 2014-03-21 DIAGNOSIS — K8010 Calculus of gallbladder with chronic cholecystitis without obstruction: Secondary | ICD-10-CM

## 2014-03-21 DIAGNOSIS — K8044 Calculus of bile duct with chronic cholecystitis without obstruction: Secondary | ICD-10-CM | POA: Diagnosis not present

## 2014-03-21 HISTORY — DX: Calculus of gallbladder with chronic cholecystitis without obstruction: K80.10

## 2014-03-24 ENCOUNTER — Encounter (HOSPITAL_COMMUNITY)
Admission: RE | Admit: 2014-03-24 | Discharge: 2014-03-24 | Disposition: A | Discharge status: Home / Self Care | Payer: Medicare Other | Source: Ambulatory Visit | Attending: Surgery | Admitting: Surgery

## 2014-03-24 ENCOUNTER — Encounter (HOSPITAL_COMMUNITY): Payer: Self-pay

## 2014-03-24 ENCOUNTER — Other Ambulatory Visit: Payer: Self-pay

## 2014-03-24 DIAGNOSIS — E039 Hypothyroidism, unspecified: Secondary | ICD-10-CM | POA: Diagnosis not present

## 2014-03-24 DIAGNOSIS — M1990 Unspecified osteoarthritis, unspecified site: Secondary | ICD-10-CM | POA: Diagnosis not present

## 2014-03-24 DIAGNOSIS — J45909 Unspecified asthma, uncomplicated: Secondary | ICD-10-CM | POA: Diagnosis not present

## 2014-03-24 DIAGNOSIS — Z79899 Other long term (current) drug therapy: Secondary | ICD-10-CM | POA: Diagnosis not present

## 2014-03-24 DIAGNOSIS — K8010 Calculus of gallbladder with chronic cholecystitis without obstruction: Secondary | ICD-10-CM | POA: Diagnosis not present

## 2014-03-24 DIAGNOSIS — E780 Pure hypercholesterolemia: Secondary | ICD-10-CM | POA: Diagnosis not present

## 2014-03-24 DIAGNOSIS — I10 Essential (primary) hypertension: Secondary | ICD-10-CM | POA: Diagnosis not present

## 2014-03-24 HISTORY — DX: Other specified postprocedural states: Z98.890

## 2014-03-24 HISTORY — DX: Other specified postprocedural states: R11.2

## 2014-03-24 HISTORY — DX: Calculus of gallbladder without cholecystitis without obstruction: K80.20

## 2014-03-24 HISTORY — DX: Pneumonia, unspecified organism: J18.9

## 2014-03-24 HISTORY — DX: Reserved for inherently not codable concepts without codable children: IMO0001

## 2014-03-24 LAB — BASIC METABOLIC PANEL
Anion gap: 12 (ref 5–15)
BUN: 12 mg/dL (ref 6–23)
CHLORIDE: 98 meq/L (ref 96–112)
CO2: 25 mEq/L (ref 19–32)
CREATININE: 0.77 mg/dL (ref 0.50–1.10)
Calcium: 9.6 mg/dL (ref 8.4–10.5)
GFR calc non Af Amer: 85 mL/min — ABNORMAL LOW (ref >90)
Glucose, Bld: 155 mg/dL — ABNORMAL HIGH (ref 70–99)
Potassium: 4 mEq/L (ref 3.7–5.3)
Sodium: 135 mEq/L — ABNORMAL LOW (ref 137–147)

## 2014-03-24 LAB — CBC
HCT: 41.3 % (ref 36.0–46.0)
Hemoglobin: 13.4 g/dL (ref 12.0–15.0)
MCH: 27.9 pg (ref 26.0–34.0)
MCHC: 32.4 g/dL (ref 30.0–36.0)
MCV: 86 fL (ref 78.0–100.0)
PLATELETS: 279 10*3/uL (ref 150–400)
RBC: 4.8 MIL/uL (ref 3.87–5.11)
RDW: 13.7 % (ref 11.5–15.5)
WBC: 6.8 10*3/uL (ref 4.0–10.5)

## 2014-03-24 NOTE — Progress Notes (Signed)
   03/24/14 1007  OBSTRUCTIVE SLEEP APNEA  Have you ever been diagnosed with sleep apnea through a sleep study? No  Do you snore loudly (loud enough to be heard through closed doors)?  0  Do you often feel tired, fatigued, or sleepy during the daytime? 1  Has anyone observed you stop breathing during your sleep? 0  Do you have, or are you being treated for high blood pressure? 1  BMI more than 35 kg/m2? 1  Age over 67 years old? 1  Neck circumference greater than 40 cm/16 inches? 0  Gender: 0  Obstructive Sleep Apnea Score 4  Score 4 or greater  Results sent to PCP

## 2014-03-24 NOTE — Patient Instructions (Signed)
YOUR SURGERY IS SCHEDULED AT [facility]  ON:  Friday November 6  REPORT TO  SHORT STAY CENTER AT:  8:00 AM   DO NOT EAT OR DRINK ANYTHING AFTER MIDNIGHT THE NIGHT BEFORE YOUR SURGERY.  YOU MAY BRUSH YOUR TEETH, RINSE OUT YOUR MOUTH--BUT NO WATER, NO FOOD, NO CHEWING GUM, NO MINTS, NO CANDIES, NO CHEWING TOBACCO.  PLEASE TAKE THE FOLLOWING MEDICATIONS THE AM OF YOUR SURGERY WITH A FEW SIPS OF WATER:  AMLODIPINE, ALLEGRA, LEVOTHYROXINE.  USE YOUR FLONASE NASAL SPRAY.  USE YOUR SYMBICORT AND ALBUTEROL INHALERS.   DO NOT BRING VALUABLES, MONEY, CREDIT CARDS.  DO NOT WEAR JEWELRY, MAKE-UP, NAIL POLISH AND NO METAL PINS OR CLIPS IN YOUR HAIR. CONTACT LENS, DENTURES / PARTIALS, GLASSES SHOULD NOT BE WORN TO SURGERY AND IN MOST CASES-HEARING AIDS WILL NEED TO BE REMOVED.  BRING YOUR GLASSES CASE, ANY EQUIPMENT NEEDED FOR YOUR CONTACT LENS. FOR PATIENTS ADMITTED TO THE HOSPITAL--CHECK OUT TIME THE DAY OF DISCHARGE IS 11:00 AM.  ALL INPATIENT ROOMS ARE PRIVATE - WITH BATHROOM, TELEPHONE, TELEVISION AND WIFI INTERNET.  IF YOU ARE BEING DISCHARGED THE SAME DAY OF YOUR SURGERY--YOU CAN NOT DRIVE YOURSELF HOME--AND SHOULD NOT GO HOME ALONE BY TAXI OR BUS.  NO DRIVING OR OPERATING MACHINERY, OR MAKING LEGAL DECISIONS FOR 24 HOURS FOLLOWING ANESTHESIA / PAIN MEDICATIONS.  PLEASE MAKE ARRANGEMENTS FOR SOMEONE TO BE WITH YOU AT HOME THE FIRST 24 HOURS AFTER SURGERY. RESPONSIBLE DRIVER'S NAME / PHONE  PT'S HUSBAND WILL BE WITH HER                                                     PLEASE BE AWARE THAT YOU MAY NEED ADDITIONAL BLOOD DRAWN DAY OF YOUR SURGERY  _______________________________________________________________________   [facility] - Preparing for Surgery Before surgery, you can play an important role.  Because skin is not sterile, your skin needs to be as free of germs as possible.  You can reduce the number of germs on your skin by washing with CHG (chlorahexidine gluconate)  soap before surgery.  CHG is an antiseptic cleaner which kills germs and bonds with the skin to continue killing germs even after washing. Please DO NOT use if you have an allergy to CHG or antibacterial soaps.  If your skin becomes reddened/irritated stop using the CHG and inform your nurse when you arrive at Short Stay. Do not shave (including legs and underarms) for at least 48 hours prior to the first CHG shower.  You may shave your face/neck. Please follow these instructions carefully:  1.  Shower with CHG Soap the night before surgery and the  morning of Surgery.  2.  If you choose to wash your hair, wash your hair first as usual with your  normal  shampoo.  3.  After you shampoo, rinse your hair and body thoroughly to remove the  shampoo.                           4.  Use CHG as you would any other liquid soap.  You can apply chg directly  to the skin and wash                       Gently with a scrungie or  clean washcloth.  5.  Apply the CHG Soap to your body ONLY FROM THE NECK DOWN.   Do not use on face/ open                           Wound or open sores. Avoid contact with eyes, ears mouth and genitals (private parts).                       Wash face,  Genitals (private parts) with your normal soap.             6.  Wash thoroughly, paying special attention to the area where your surgery  will be performed.  7.  Thoroughly rinse your body with warm water from the neck down.  8.  DO NOT shower/wash with your normal soap after using and rinsing off  the CHG Soap.                9.  Pat yourself dry with a clean towel.            10.  Wear clean pajamas.            11.  Place clean sheets on your bed the night of your first shower and do not  sleep with pets. Day of Surgery : Do not apply any lotions/deodorants the morning of surgery.  Please wear clean clothes to the hospital/surgery center.  FAILURE TO FOLLOW THESE INSTRUCTIONS MAY RESULT IN THE CANCELLATION OF YOUR SURGERY PATIENT  SIGNATURE_________________________________  NURSE SIGNATURE__________________________________  ________________________________________________________________________

## 2014-03-24 NOTE — Pre-Procedure Instructions (Signed)
CXR REPORT ON PT'S CHART FROM DR. Abigail Butts [facility] / [facility] AT [place] FROM 06/09/13 EKG WAS DONE TODAY - PREOP AT [facility]. CHART GIVEN TO FOLLOW UP NURSE SHARON SHOFFNER FOR REVIEW OF PREOP LAB RESULTS.

## 2014-03-25 ENCOUNTER — Encounter (HOSPITAL_COMMUNITY)
Admission: RE | Disposition: A | Discharge status: Home / Self Care | Payer: Self-pay | Source: Ambulatory Visit | Attending: Surgery

## 2014-03-25 ENCOUNTER — Encounter (HOSPITAL_COMMUNITY): Payer: Self-pay | Admitting: Surgery

## 2014-03-25 ENCOUNTER — Ambulatory Visit (HOSPITAL_COMMUNITY): Payer: Medicare Other | Admitting: Anesthesiology

## 2014-03-25 ENCOUNTER — Ambulatory Visit (HOSPITAL_COMMUNITY): Payer: Medicare Other

## 2014-03-25 ENCOUNTER — Observation Stay (HOSPITAL_COMMUNITY)
Admission: RE | Admit: 2014-03-25 | Discharge: 2014-03-26 | Disposition: A | Discharge status: Home / Self Care | Payer: Medicare Other | Source: Ambulatory Visit | Attending: Surgery | Admitting: Surgery

## 2014-03-25 DIAGNOSIS — K8010 Calculus of gallbladder with chronic cholecystitis without obstruction: Secondary | ICD-10-CM | POA: Diagnosis not present

## 2014-03-25 DIAGNOSIS — M1990 Unspecified osteoarthritis, unspecified site: Secondary | ICD-10-CM | POA: Insufficient documentation

## 2014-03-25 DIAGNOSIS — J45909 Unspecified asthma, uncomplicated: Secondary | ICD-10-CM | POA: Insufficient documentation

## 2014-03-25 DIAGNOSIS — E039 Hypothyroidism, unspecified: Secondary | ICD-10-CM | POA: Insufficient documentation

## 2014-03-25 DIAGNOSIS — R0602 Shortness of breath: Secondary | ICD-10-CM | POA: Diagnosis not present

## 2014-03-25 DIAGNOSIS — Z79899 Other long term (current) drug therapy: Secondary | ICD-10-CM | POA: Insufficient documentation

## 2014-03-25 DIAGNOSIS — K8020 Calculus of gallbladder without cholecystitis without obstruction: Secondary | ICD-10-CM | POA: Diagnosis not present

## 2014-03-25 DIAGNOSIS — I10 Essential (primary) hypertension: Secondary | ICD-10-CM | POA: Insufficient documentation

## 2014-03-25 DIAGNOSIS — Z419 Encounter for procedure for purposes other than remedying health state, unspecified: Secondary | ICD-10-CM

## 2014-03-25 DIAGNOSIS — E780 Pure hypercholesterolemia: Secondary | ICD-10-CM | POA: Diagnosis not present

## 2014-03-25 DIAGNOSIS — R109 Unspecified abdominal pain: Secondary | ICD-10-CM | POA: Diagnosis not present

## 2014-03-25 DIAGNOSIS — K810 Acute cholecystitis: Secondary | ICD-10-CM | POA: Diagnosis not present

## 2014-03-25 HISTORY — PX: CHOLECYSTECTOMY: SHX55

## 2014-03-25 SURGERY — LAPAROSCOPIC CHOLECYSTECTOMY WITH INTRAOPERATIVE CHOLANGIOGRAM
Anesthesia: General | Site: Abdomen

## 2014-03-25 MED ORDER — BUPIVACAINE-EPINEPHRINE 0.25% -1:200000 IJ SOLN
INTRAMUSCULAR | Status: DC | PRN
  Administered 2014-03-25: 15 mL

## 2014-03-25 MED ORDER — 0.9 % SODIUM CHLORIDE (POUR BTL) OPTIME
TOPICAL | Status: DC | PRN
  Administered 2014-03-25: 1000 mL

## 2014-03-25 MED ORDER — GLYCOPYRROLATE 0.2 MG/ML IJ SOLN
INTRAMUSCULAR | Status: AC
  Filled 2014-03-25: qty 3

## 2014-03-25 MED ORDER — ONDANSETRON HCL 4 MG PO TABS: 4.0000 mg | ORAL_TABLET | Freq: Four times a day (QID) | ORAL | Status: DC | PRN

## 2014-03-25 MED ORDER — DEXAMETHASONE SODIUM PHOSPHATE 10 MG/ML IJ SOLN
INTRAMUSCULAR | Status: DC | PRN
  Administered 2014-03-25: 10 mg via INTRAVENOUS

## 2014-03-25 MED ORDER — LEVOTHYROXINE SODIUM 100 MCG PO TABS
100.0000 ug | ORAL_TABLET | Freq: Every day | ORAL | Status: DC
  Administered 2014-03-26: 100 ug via ORAL
  Filled 2014-03-25 (×2): qty 1

## 2014-03-25 MED ORDER — GLYCOPYRROLATE 0.2 MG/ML IJ SOLN
INTRAMUSCULAR | Status: DC | PRN
  Administered 2014-03-25: 0.6 mg via INTRAVENOUS

## 2014-03-25 MED ORDER — HYDROMORPHONE HCL 1 MG/ML IJ SOLN
INTRAMUSCULAR | Status: AC
  Filled 2014-03-25: qty 1

## 2014-03-25 MED ORDER — CIPROFLOXACIN IN D5W 400 MG/200ML IV SOLN
400.0000 mg | INTRAVENOUS | Status: AC
  Administered 2014-03-25: 400 mg via INTRAVENOUS

## 2014-03-25 MED ORDER — ONDANSETRON HCL 4 MG/2ML IJ SOLN
INTRAMUSCULAR | Status: DC | PRN
  Administered 2014-03-25: 4 mg via INTRAVENOUS

## 2014-03-25 MED ORDER — ROCURONIUM BROMIDE 100 MG/10ML IV SOLN
INTRAVENOUS | Status: DC | PRN
  Administered 2014-03-25: 50 mg via INTRAVENOUS

## 2014-03-25 MED ORDER — PROMETHAZINE HCL 25 MG/ML IJ SOLN
6.2500 mg | INTRAMUSCULAR | Status: AC | PRN
  Administered 2014-03-25 (×3): 6.25 mg via INTRAVENOUS

## 2014-03-25 MED ORDER — LACTATED RINGERS IV SOLN
INTRAVENOUS | Status: DC | PRN
  Administered 2014-03-25 (×2): via INTRAVENOUS

## 2014-03-25 MED ORDER — HYDROMORPHONE HCL 2 MG/ML IJ SOLN
INTRAMUSCULAR | Status: AC
  Filled 2014-03-25: qty 1

## 2014-03-25 MED ORDER — HYDROMORPHONE HCL 1 MG/ML IJ SOLN
0.2500 mg | INTRAMUSCULAR | Status: DC | PRN
  Administered 2014-03-25: 0.25 mg via INTRAVENOUS
  Administered 2014-03-25: 0.5 mg via INTRAVENOUS

## 2014-03-25 MED ORDER — LACTATED RINGERS IR SOLN
Status: DC | PRN
  Administered 2014-03-25: 1000 mL

## 2014-03-25 MED ORDER — FENTANYL CITRATE 0.05 MG/ML IJ SOLN
INTRAMUSCULAR | Status: AC
  Filled 2014-03-25: qty 5

## 2014-03-25 MED ORDER — FENTANYL CITRATE 0.05 MG/ML IJ SOLN
INTRAMUSCULAR | Status: DC | PRN
  Administered 2014-03-25: 50 ug via INTRAVENOUS
  Administered 2014-03-25 (×2): 100 ug via INTRAVENOUS

## 2014-03-25 MED ORDER — HYDROMORPHONE HCL 1 MG/ML IJ SOLN
INTRAMUSCULAR | Status: DC | PRN
  Administered 2014-03-25 (×2): 0.5 mg via INTRAVENOUS

## 2014-03-25 MED ORDER — LIDOCAINE HCL (CARDIAC) 20 MG/ML IV SOLN
INTRAVENOUS | Status: DC | PRN
  Administered 2014-03-25: 60 mg via INTRAVENOUS

## 2014-03-25 MED ORDER — AMLODIPINE BESYLATE 2.5 MG PO TABS
2.5000 mg | ORAL_TABLET | Freq: Every morning | ORAL | Status: DC
  Administered 2014-03-26: 2.5 mg via ORAL
  Filled 2014-03-25: qty 1

## 2014-03-25 MED ORDER — PROMETHAZINE HCL 25 MG/ML IJ SOLN
INTRAMUSCULAR | Status: AC
  Filled 2014-03-25: qty 1

## 2014-03-25 MED ORDER — PROPOFOL 10 MG/ML IV BOLUS
INTRAVENOUS | Status: DC | PRN
  Administered 2014-03-25: 150 mg via INTRAVENOUS
  Administered 2014-03-25: 50 mg via INTRAVENOUS

## 2014-03-25 MED ORDER — DEXAMETHASONE SODIUM PHOSPHATE 10 MG/ML IJ SOLN
INTRAMUSCULAR | Status: AC
  Filled 2014-03-25: qty 1

## 2014-03-25 MED ORDER — HYDROMORPHONE HCL 1 MG/ML IJ SOLN: 1.0000 mg | INTRAMUSCULAR | Status: DC | PRN

## 2014-03-25 MED ORDER — ROCURONIUM BROMIDE 100 MG/10ML IV SOLN
INTRAVENOUS | Status: AC
  Filled 2014-03-25: qty 1

## 2014-03-25 MED ORDER — BUDESONIDE-FORMOTEROL FUMARATE 160-4.5 MCG/ACT IN AERO
1.0000 | INHALATION_SPRAY | Freq: Two times a day (BID) | RESPIRATORY_TRACT | Status: DC
Start: 2014-03-25 — End: 2014-03-26
  Administered 2014-03-25: 1 via RESPIRATORY_TRACT
  Filled 2014-03-25: qty 6

## 2014-03-25 MED ORDER — FLUTICASONE PROPIONATE 50 MCG/ACT NA SUSP
1.0000 | Freq: Every day | NASAL | Status: DC
  Filled 2014-03-25: qty 16

## 2014-03-25 MED ORDER — MIDAZOLAM HCL 2 MG/2ML IJ SOLN
INTRAMUSCULAR | Status: AC
  Filled 2014-03-25: qty 2

## 2014-03-25 MED ORDER — ONDANSETRON HCL 4 MG/2ML IJ SOLN
4.0000 mg | Freq: Four times a day (QID) | INTRAMUSCULAR | Status: DC | PRN
  Administered 2014-03-25: 4 mg via INTRAVENOUS
  Filled 2014-03-25: qty 2

## 2014-03-25 MED ORDER — ONDANSETRON HCL 4 MG/2ML IJ SOLN
INTRAMUSCULAR | Status: AC
  Filled 2014-03-25: qty 2

## 2014-03-25 MED ORDER — NEOSTIGMINE METHYLSULFATE 10 MG/10ML IV SOLN
INTRAVENOUS | Status: AC
  Filled 2014-03-25: qty 1

## 2014-03-25 MED ORDER — ALBUTEROL SULFATE HFA 108 (90 BASE) MCG/ACT IN AERS: 2.0000 | INHALATION_SPRAY | RESPIRATORY_TRACT | Status: DC | PRN

## 2014-03-25 MED ORDER — NEOSTIGMINE METHYLSULFATE 10 MG/10ML IV SOLN
INTRAVENOUS | Status: DC | PRN
  Administered 2014-03-25: 4 mg via INTRAVENOUS

## 2014-03-25 MED ORDER — HYDROCODONE-ACETAMINOPHEN 5-325 MG PO TABS: 1.0000 | ORAL_TABLET | ORAL | Status: DC | PRN

## 2014-03-25 MED ORDER — FLUTICASONE PROPIONATE HFA 44 MCG/ACT IN AERO: 2.0000 | INHALATION_SPRAY | Freq: Two times a day (BID) | RESPIRATORY_TRACT | Status: DC

## 2014-03-25 MED ORDER — ACETAMINOPHEN 325 MG PO TABS: 650.0000 mg | ORAL_TABLET | ORAL | Status: DC | PRN

## 2014-03-25 MED ORDER — MIDAZOLAM HCL 5 MG/5ML IJ SOLN
INTRAMUSCULAR | Status: DC | PRN
  Administered 2014-03-25: 2 mg via INTRAVENOUS

## 2014-03-25 MED ORDER — CIPROFLOXACIN IN D5W 400 MG/200ML IV SOLN
INTRAVENOUS | Status: AC
  Filled 2014-03-25: qty 200

## 2014-03-25 MED ORDER — PROPOFOL 10 MG/ML IV BOLUS
INTRAVENOUS | Status: AC
  Filled 2014-03-25: qty 20

## 2014-03-25 MED ORDER — KCL IN DEXTROSE-NACL 20-5-0.45 MEQ/L-%-% IV SOLN
INTRAVENOUS | Status: DC
  Administered 2014-03-25: 15:00:00 via INTRAVENOUS
  Filled 2014-03-25 (×2): qty 1000

## 2014-03-25 MED ORDER — LIDOCAINE HCL (CARDIAC) 20 MG/ML IV SOLN
INTRAVENOUS | Status: AC
  Filled 2014-03-25: qty 5

## 2014-03-25 MED ORDER — IOHEXOL 300 MG/ML  SOLN
INTRAMUSCULAR | Status: DC | PRN
  Administered 2014-03-25: 9 mL

## 2014-03-25 MED ORDER — CYCLOSPORINE 0.05 % OP EMUL
1.0000 [drp] | Freq: Every day | OPHTHALMIC | Status: DC
  Administered 2014-03-26: 1 [drp] via OPHTHALMIC
  Filled 2014-03-25 (×2): qty 1

## 2014-03-25 MED ORDER — BUPIVACAINE-EPINEPHRINE (PF) 0.25% -1:200000 IJ SOLN
INTRAMUSCULAR | Status: AC
  Filled 2014-03-25: qty 30

## 2014-03-25 MED ORDER — ALBUTEROL SULFATE (2.5 MG/3ML) 0.083% IN NEBU: 2.5000 mg | INHALATION_SOLUTION | RESPIRATORY_TRACT | Status: DC | PRN

## 2014-03-25 SURGICAL SUPPLY — 51 items
ADH SKN CLS APL DERMABOND .7 (GAUZE/BANDAGES/DRESSINGS) ×1
APL SKNCLS STERI-STRIP NONHPOA (GAUZE/BANDAGES/DRESSINGS) ×1
APPLIER CLIP ROT 10 11.4 M/L (STAPLE) ×2
APR CLP MED LRG 11.4X10 (STAPLE) ×1
BAG SPEC RTRVL LRG 6X4 10 (ENDOMECHANICALS) ×1
BENZOIN TINCTURE PRP APPL 2/3 (GAUZE/BANDAGES/DRESSINGS) ×2 IMPLANT
CABLE HIGH FREQUENCY MONO STRZ (ELECTRODE) ×1 IMPLANT
CANISTER SUCTION 2500CC (MISCELLANEOUS) ×1 IMPLANT
CHLORAPREP W/TINT 26ML (MISCELLANEOUS) ×2 IMPLANT
CLIP APPLIE ROT 10 11.4 M/L (STAPLE) ×1 IMPLANT
COVER MAYO STAND STRL (DRAPES) ×2 IMPLANT
DECANTER SPIKE VIAL GLASS SM (MISCELLANEOUS) ×1 IMPLANT
DERMABOND ADVANCED (GAUZE/BANDAGES/DRESSINGS) ×1
DERMABOND ADVANCED .7 DNX12 (GAUZE/BANDAGES/DRESSINGS) IMPLANT
DRAPE C-ARM 42X120 X-RAY (DRAPES) ×2 IMPLANT
DRAPE LAPAROSCOPIC ABDOMINAL (DRAPES) ×2 IMPLANT
DRAPE UTILITY XL STRL (DRAPES) ×2 IMPLANT
ELECT REM PT RETURN 9FT ADLT (ELECTROSURGICAL) ×2
ELECTRODE REM PT RTRN 9FT ADLT (ELECTROSURGICAL) ×1 IMPLANT
GAUZE SPONGE 2X2 8PLY STRL LF (GAUZE/BANDAGES/DRESSINGS) IMPLANT
GLOVE BIO SURGEON STRL SZ7.5 (GLOVE) ×1 IMPLANT
GLOVE BIOGEL PI IND STRL 6.5 (GLOVE) IMPLANT
GLOVE BIOGEL PI IND STRL 7.0 (GLOVE) IMPLANT
GLOVE BIOGEL PI IND STRL 7.5 (GLOVE) IMPLANT
GLOVE BIOGEL PI INDICATOR 6.5 (GLOVE) ×1
GLOVE BIOGEL PI INDICATOR 7.0 (GLOVE) ×1
GLOVE BIOGEL PI INDICATOR 7.5 (GLOVE) ×1
GLOVE SURG ORTHO 8.0 STRL STRW (GLOVE) ×2 IMPLANT
GLOVE SURG SS PI 6.5 STRL IVOR (GLOVE) ×1 IMPLANT
GOWN STRL REUS W/TWL XL LVL3 (GOWN DISPOSABLE) ×6 IMPLANT
HEMOSTAT SURGICEL 4X8 (HEMOSTASIS) IMPLANT
KIT BASIN OR (CUSTOM PROCEDURE TRAY) ×2 IMPLANT
NS IRRIG 1000ML POUR BTL (IV SOLUTION) ×2 IMPLANT
POUCH SPECIMEN RETRIEVAL 10MM (ENDOMECHANICALS) ×2 IMPLANT
SCISSORS LAP 5X35 DISP (ENDOMECHANICALS) ×2 IMPLANT
SET CHOLANGIOGRAPH MIX (MISCELLANEOUS) ×2 IMPLANT
SET IRRIG TUBING LAPAROSCOPIC (IRRIGATION / IRRIGATOR) ×2 IMPLANT
SLEEVE XCEL OPT CAN 5 100 (ENDOMECHANICALS) ×2 IMPLANT
SOLUTION ANTI FOG 6CC (MISCELLANEOUS) ×2 IMPLANT
SPONGE GAUZE 2X2 STER 10/PKG (GAUZE/BANDAGES/DRESSINGS) ×1
STRIP CLOSURE SKIN 1/2X4 (GAUZE/BANDAGES/DRESSINGS) ×2 IMPLANT
SUT MNCRL AB 4-0 PS2 18 (SUTURE) ×2 IMPLANT
TAPE CLOTH SURG 4X10 WHT LF (GAUZE/BANDAGES/DRESSINGS) ×1 IMPLANT
TOWEL OR 17X26 10 PK STRL BLUE (TOWEL DISPOSABLE) ×2 IMPLANT
TOWEL OR NON WOVEN STRL DISP B (DISPOSABLE) ×2 IMPLANT
TRAY LAPAROSCOPIC (CUSTOM PROCEDURE TRAY) ×2 IMPLANT
TROCAR BLADELESS OPT 5 100 (ENDOMECHANICALS) ×2 IMPLANT
TROCAR SLEEVE XCEL 5X75 (ENDOMECHANICALS) ×1 IMPLANT
TROCAR XCEL BLUNT TIP 100MML (ENDOMECHANICALS) ×2 IMPLANT
TROCAR XCEL NON-BLD 11X100MML (ENDOMECHANICALS) ×2 IMPLANT
TUBING INSUFFLATION 10FT LAP (TUBING) ×2 IMPLANT

## 2014-03-25 NOTE — Anesthesia Postprocedure Evaluation (Signed)
  Anesthesia Post-op Note  Patient: Lorraine Buckley  Procedure(s) Performed: Procedure(s) (LRB): LAPAROSCOPIC CHOLECYSTECTOMY WITH INTRAOPERATIVE CHOLANGIOGRAM (N/A)  Patient Location: PACU  Anesthesia Type: General  Level of Consciousness: awake and alert   Airway and Oxygen Therapy: Patient Spontanous Breathing  Post-op Pain: mild  Post-op Assessment: Post-op Vital signs reviewed, Patient's Cardiovascular Status Stable, Respiratory Function Stable, Patent Airway and No signs of Nausea or vomiting  Last Vitals:  Filed Vitals:   03/25/14 1309  BP: 159/80  Pulse: 97  Temp: 36.7 C  Resp: 12    Post-op Vital Signs: stable   Complications: No apparent anesthesia complications

## 2014-03-25 NOTE — Plan of Care (Signed)
Problem: Phase I Progression Outcomes Goal: Initial discharge plan identified Outcome: Completed/Met Date Met:  03/25/14     

## 2014-03-25 NOTE — Plan of Care (Signed)
Problem: Phase I Progression Outcomes Goal: Pain controlled with appropriate interventions Outcome: Completed/Met Date Met:  03/25/14 Goal: Incision/dressings dry and intact Outcome: Completed/Met Date Met:  03/25/14 Goal: Sutures/staples intact Outcome: Not Applicable Date Met:  47/12/52 Goal: Tubes/drains patent Outcome: Not Applicable Date Met:  71/29/29

## 2014-03-25 NOTE — Discharge Instructions (Signed)
  [facility]  LAPAROSCOPIC SURGERY - POST-OP INSTRUCTIONS  Always review your discharge instruction sheet given to you by the facility where your surgery was performed.  A prescription for pain medication may be given to you upon discharge.  Take your pain medication as prescribed.  If narcotic pain medicine is not needed, then you may take acetaminophen (Tylenol) or ibuprofen (Advil) as needed.  Take your usually prescribed medications unless otherwise directed.  If you need a refill on your pain medication, please contact your pharmacy.  They will contact our office to request authorization. Prescriptions will not be filled after 5 P.M. or on weekends.  You should follow a light diet the first few days after arrival home, such as soup and crackers or toast.  Be sure to include plenty of fluids daily.  Most patients will experience some swelling and bruising in the area of the incisions.  Ice packs will help.  Swelling and bruising can take several days to resolve.   It is common to experience some constipation if taking pain medication after surgery.  Increasing fluid intake and taking a stool softener (such as Colace) will usually help or prevent this problem from occurring.  A mild laxative (Milk of Magnesia or Miralax) should be taken according to package instructions if there are no bowel movements after 48 hours.  Unless discharge instructions indicate otherwise, you may remove your bandages 24-48 hours after surgery, and you may shower at that time.  You may have steri-strips (small skin tapes) in place directly over the incision.  These strips should be left on the skin for 7-10 days.  If your surgeon used skin glue on the incision, you may shower in 24 hours.  The glue will flake off over the next 2-3 weeks.  Any sutures or staples will be removed at the office during your follow-up visit.  ACTIVITIES:  You may resume regular (light) daily activities beginning the  next day-such as daily self-care, walking, climbing stairs-gradually increasing activities as tolerated.  You may have sexual intercourse when it is comfortable.  Refrain from any heavy lifting or straining until approved by your doctor.  You may drive when you are no longer taking prescription pain medication, you can comfortably wear a seatbelt, and you can safely maneuver your car and apply brakes.  You should see your doctor in the office for a follow-up appointment approximately 2-3 weeks after your surgery.  Make sure that you call for this appointment within a day or two after you arrive home to insure a convenient appointment time.  WHEN TO CALL YOUR DOCTOR: 1. Fever over 101.0 2. Inability to urinate 3. Continued bleeding from incision 4. Increased pain, redness, or drainage from the incision 5. Increasing abdominal pain  The clinic staff is available to answer your questions during regular business hours.  Please don't hesitate to call and ask to speak to one of the nurses for clinical concerns.  If you have a medical emergency, go to the nearest emergency room or call 911.  A surgeon from [facility] is always on call for the hospital.  Gwin Eagon M. Synethia Endicott, MD, FACS [facility] Office: [phone] Toll Free:  [phone] FAX [phone]  Web site: [email or web address] 

## 2014-03-25 NOTE — Anesthesia Preprocedure Evaluation (Addendum)
Anesthesia Evaluation  Patient identified by MRN, date of birth, ID band Patient awake    Reviewed: Allergy & Precautions, H&P , NPO status , Patient's Chart, lab work & pertinent test results  History of Anesthesia Complications (+) PONV and history of anesthetic complications  Airway Mallampati: II  TM Distance: >3 FB Neck ROM: Full    Dental no notable dental hx.    Pulmonary shortness of breath, asthma , pneumonia -,  breath sounds clear to auscultation  Pulmonary exam normal       Cardiovascular hypertension, + Valvular Problems/Murmurs Rhythm:Regular Rate:Normal     Neuro/Psych negative neurological ROS  negative psych ROS   GI/Hepatic negative GI ROS, Neg liver ROS,   Endo/Other  Hypothyroidism   Renal/GU negative Renal ROS  negative genitourinary   Musculoskeletal  (+) Arthritis -,   Abdominal (+) + obese,   Peds negative pediatric ROS (+)  Hematology negative hematology ROS (+)   Anesthesia Other Findings   Reproductive/Obstetrics negative OB ROS                            Anesthesia Physical Anesthesia Plan  ASA: II  Anesthesia Plan: General   Post-op Pain Management:    Induction: Intravenous  Airway Management Planned: Oral ETT  Additional Equipment:   Intra-op Plan:   Post-operative Plan: Extubation in OR  Informed Consent: I have reviewed the patients History and Physical, chart, labs and discussed the procedure including the risks, benefits and alternatives for the proposed anesthesia with the patient or authorized representative who has indicated his/her understanding and acceptance.   Dental advisory given  Plan Discussed with: CRNA  Anesthesia Plan Comments:         Anesthesia Quick Evaluation

## 2014-03-25 NOTE — Transfer of Care (Signed)
Immediate Anesthesia Transfer of Care Note  Patient: Lorraine Buckley  Procedure(s) Performed: Procedure(s): LAPAROSCOPIC CHOLECYSTECTOMY WITH INTRAOPERATIVE CHOLANGIOGRAM (N/A)  Patient Location: PACU  Anesthesia Type:General  Level of Consciousness: awake, alert  and oriented  Airway & Oxygen Therapy: Patient Spontanous Breathing and Patient connected to face mask oxygen  Post-op Assessment: Report given to PACU RN and Post -op Vital signs reviewed and stable  Post vital signs: Reviewed and stable  Complications: No apparent anesthesia complications

## 2014-03-25 NOTE — Op Note (Signed)
Procedure Note  Pre-operative Diagnosis:  Chronic cholecystitis, cholelithiasis  Post-operative Diagnosis:  same  Surgeon:  [name], MD, FACS  Assistant:  none   Procedure:  Laparoscopic cholecystectomy with intra-operative cholangiography  Anesthesia:  General  Estimated Blood Loss:  minimal  Drains: none         Specimen: Gallbladder to pathology  Indications:  Patient is a 68 yo WF with symptomatic cholelithiasis.  Procedure Details:  The patient was seen in the pre-op holding area. The risks, benefits, complications, treatment options, and expected outcomes were previously discussed with the patient. The patient agreed with the proposed plan and has signed the informed consent form.  The patient was brought to the Operating Room, identified as Lorraine Buckley and the procedure verified as laparoscopic cholecystectomy with intraoperative cholangiography. A "time out" was completed and the above information confirmed.  The patient was placed in the supine position. Following induction of general anesthesia, the abdomen was prepped and draped in the usual aseptic fashion.  An incision was made in the skin near the umbilicus. The midline fascia was incised and the peritoneal cavity was entered and a Hasson canula was introduced under direct vision.  The Hasson canula was secured with a 0-Vicryl pursestring suture. Pneumoperitoneum was established with carbon dioxide. Additional trocars were introduced under direct vision along the right costal margin in the midline, mid-clavicular line, and anterior axillary line.   The gallbladder was identified and the fundus grasped and retracted cephalad. Adhesions were taken down bluntly and the electrocautery was utilized as needed, taking care not to injure any adjacent structures. The infundibulum was grasped and retracted laterally, exposing the peritoneum overlying the triangle of Calot. The peritoneum was incised and structures  exposed with blunt dissection. The cystic duct was clearly identified, bluntly dissected circumferentially, and clipped at the neck of the gallbladder.  An incision was made in the cystic duct and the cholangiogram catheter introduced. The catheter was secured using an ligaclip.  Real-time cholangiography was performed using C-arm fluoroscopy.  There was rapid filling of a normal caliber common bile duct.  There was reflux of contrast into the left and right hepatic ductal systems.  There was free flow distally into the duodenum without filling defect or obstruction.  The catheter was removed from the peritoneal cavity.  The cystic duct was then ligated with surgical clips and divided. The cystic artery was identified, dissected circumferentially, ligated with ligaclips, and divided.  The gallbladder was dissected away from the liver bed using the electrocautery for hemostasis. The gallbladder was completely removed from the liver and placed into an endocatch bag. The gallbladder was removed in the endocatch bag through the umbilical port site and submitted to pathology for review.  The right upper quadrant was irrigated and the gallbladder bed was inspected. Hemostasis was achieved with the electrocautery.  Pneumoperitoneum was released after viewing removal of the trocars with good hemostasis noted. The umbilical wound was irrigated and the fascia was then closed with the pursestring suture.  Local anesthetic was infiltrated at all port sites. The skin incisions were closed with 4-0 Monocril subcuticular sutures and steri-strips and dressings were applied.  Instrument, sponge, and needle counts were correct at the conclusion of the case.  The patient was awakened from anesthesia and brought to the recovery room in stable condition.  The patient tolerated the procedure well.   [name], MD, [facility] Office: [phone]

## 2014-03-25 NOTE — H&P (Signed)
General Surgery [facility]  Lorraine Buckley 03/21/2014 9:12 AM Location: [facility] Patient #: [id number] DOB: [birth year] Married / Language: Cleophus Molt / Race: White Female  History of Present Illness Lorraine Regal MD; 03/21/2014 9:51 AM) Patient words: new pt. eval GB.  The patient is a 68 year old female who presents for evaluation of gallbladder disease. Patient is referred by Dr. Hulan Fess for evaluation of symptomatic cholelithiasis.  Patient developed intermittent epigastric abdominal pressure waking her from sleep during the summer months this year. This does not occur every night. It has been associated with mild nausea but no emesis. She denies fevers or chills. She denies jaundice or acholic stools. Patient is unable to associate it with any particular type of food.  Patient was evaluated in September 2015 by her primary care physician. She underwent abdominal ultrasound on February 25, 2014 which documented multiple gallstones. She is now referred for consideration of cholecystectomy for treatment of symptomatic cholelithiasis.  Patient has a family history of gallbladder disease in her sister and her grandmother. Patient has had previous total abdominal hysterectomy. She has had no other problems with hepatobiliary or pancreatic disease.    Other Problems Briant Cedar, CMA; 03/21/2014 9:12 AM) Arthritis Asthma Cholelithiasis High blood pressure Hypercholesterolemia Thyroid Disease  Past Surgical History Briant Cedar, [place]; 03/21/2014 9:12 AM) Foot Surgery Right. Hysterectomy (not due to cancer) - Partial  Diagnostic Studies History Briant Cedar, [place]; 03/21/2014 9:12 AM) Colonoscopy 5-10 years ago Mammogram within last year  Allergies Briant Cedar, [place]; 03/21/2014 9:13 AM) Penicillins  Medication History Briant Cedar, CMA; 03/21/2014 9:15 AM) Albuterol Sulfate HFA (108 (90 Base)MCG/ACT Aerosol Soln,  Inhalation) Active. Norvasc (2.5MG  Tablet, Oral) Active. Flonase (50MCG/ACT Suspension, Nasal) Active. Levothroid (100MCG Tablet, Oral) Active. Restasis (0.05% Emulsion, Ophthalmic) Active.  Social History Briant Cedar, [place]; 03/21/2014 9:12 AM) Alcohol use Occasional alcohol use. Caffeine use Coffee, Tea. No drug use Tobacco use Never smoker.  Family History Briant Cedar, [place]; 03/21/2014 9:12 AM) Alcohol Abuse Father. Anesthetic complications Brother. Arthritis Brother, Father, Mother, Sister. Bleeding disorder Mother. Colon Polyps Brother. Depression Father. Diabetes Mellitus Brother. Heart Disease Brother, Mother. Hypertension Brother, Mother. Ischemic Bowel Disease Brother, Sister. Migraine Headache Father. Respiratory Condition Mother, Sister. Thyroid problems Sister.  Pregnancy / Birth History Briant Cedar, [place]; 03/21/2014 9:12 AM) Age at menarche 61 years. Age of menopause 80-50 Gravida 2 Maternal age 54-25 Para 2  Review of Systems Briant Cedar CMA; 03/21/2014 9:12 AM) General Not Present- Appetite Loss, Chills, Fatigue, Fever, Night Sweats, Weight Gain and Weight Loss. Skin Not Present- Change in Wart/Mole, Dryness, Hives, Jaundice, New Lesions, Non-Healing Wounds, Rash and Ulcer. HEENT Present- Ringing in the Ears and Seasonal Allergies. Not Present- Earache, Hearing Loss, Hoarseness, Nose Bleed, Oral Ulcers, Sinus Pain, Sore Throat, Visual Disturbances, Wears glasses/contact lenses and Yellow Eyes. Breast Not Present- Breast Mass, Breast Pain, Nipple Discharge and Skin Changes. Cardiovascular Not Present- Chest Pain, Difficulty Breathing Lying Down, Leg Cramps, Palpitations, Rapid Heart Rate, Shortness of Breath and Swelling of Extremities. Gastrointestinal Present- Bloating, Change in Bowel Habits and Excessive gas. Not Present- Abdominal Pain, Bloody Stool, Chronic diarrhea, Constipation, Difficulty Swallowing, Gets full  quickly at meals, Hemorrhoids, Indigestion, Nausea, Rectal Pain and Vomiting. Female Genitourinary Present- Frequency and Urgency. Not Present- Nocturia, Painful Urination and Pelvic Pain. Musculoskeletal Present- Joint Stiffness. Not Present- Back Pain, Joint Pain, Muscle Pain, Muscle Weakness and Swelling of Extremities. Neurological Not Present- Decreased Memory, Fainting, Headaches, Numbness, Seizures, Tingling, Tremor, Trouble walking  and Weakness. Psychiatric Not Present- Anxiety, Bipolar, Change in Sleep Pattern, Depression, Fearful and Frequent crying. Endocrine Not Present- Cold Intolerance, Excessive Hunger, Hair Changes, Heat Intolerance, Hot flashes and New Diabetes. Hematology Not Present- Easy Bruising, Excessive bleeding, Gland problems, HIV and Persistent Infections.   Vitals Briant Cedar CMA; 03/21/2014 9:17 AM) 03/21/2014 9:15 AM Weight: 231 lb Height: 67in Body Surface Area: 2.23 m Body Mass Index: 36.18 kg/m Temp.: 98.32F  Pulse: 98 (Regular)  BP: 140/74 (Sitting, Left Arm, Standard)    Physical Exam Lorraine Regal MD; 03/21/2014 9:49 AM) The physical exam findings are as follows: Note:General - appears comfortable, no distress; not diaphorectic  HEENT - normocephalic; sclerae clear, gaze conjugate; mucous membranes moist, dentition good; voice normal  Neck - symmetric on extension; no palpable anterior or posterior cervical adenopathy; bilateral thyroid nodules are present and stable in size  Chest - clear bilaterally with rhonchi, rales, or wheeze  Cor - regular rhythm with normal rate; no significant murmur  Abd - soft without distension; no tenderness; no mass  Ext - non-tender without significant edema or lymphedema  Neuro - grossly intact; no tremor    Assessment & Plan Lorraine Regal MD; 03/21/2014 9:51 AM) CALCULUS OF BILE DUCT WITH CHRONIC CHOLECYSTITIS WITHOUT OBSTRUCTION (574.40  K80.44) Current Plans  Instructed to keep  follow-up appointment as scheduled  I discussed the above findings with the patient and her husband. We reviewed her studies. She provided laboratory studies which showed normal liver function test. I provided her with written literature to review at home on laparoscopic cholecystectomy.  I have recommended proceeding with laparoscopic cholecystectomy with intraoperative cholangiography. We have discussed the procedure. We have discussed the possible conversion to open surgery. We have discussed the hospital stay to be anticipated and her postoperative recovery and return to activities. She understands and wishes to proceed in the near future.  The risks and benefits of the procedure have been discussed at length with the patient. The patient understands the proposed procedure, potential alternative treatments, and the course of recovery to be expected. All of the patient's questions have been answered at this time. The patient wishes to proceed with surgery.   Lorraine Regal, MD, [facility] Office: [phone]

## 2014-03-26 DIAGNOSIS — K8010 Calculus of gallbladder with chronic cholecystitis without obstruction: Secondary | ICD-10-CM | POA: Diagnosis not present

## 2014-03-26 NOTE — Progress Notes (Signed)
Patient ID: Lorraine Buckley, female   DOB: [birth year], 68 y.o.   MRN: [id number] 1 Day Post-Op  Subjective: No complaints this morning. Not requiring pain medication. No nausea with diet.  Objective: Vital signs in last 24 hours: Temp:  [97.7 F (36.5 C)-98.6 F (37 C)] 98.6 F (37 C) (11/07 0930) Pulse Rate:  [68-102] 68 (11/07 0930) Resp:  [7-18] 18 (11/07 0930) BP: (123-161)/(61-96) 135/71 mmHg (11/07 0930) SpO2:  [94 %-100 %] 96 % (11/07 0930) Last BM Date: 03/25/14  Intake/Output from previous day: 11/06 0701 - 11/07 0700 In: 3113.3 [P.O.:720; I.V.:2393.3] Out: 2910 [Urine:2900; Blood:10] Intake/Output this shift: Total I/O In: -  Out: 600 [Urine:600]  General appearance: alert, cooperative and no distress GI: normal findings: soft, non-tender Incision/Wound: clean and dry  Lab Results:   Recent Labs  03/24/14 0945  WBC 6.8  HGB 13.4  HCT 41.3  PLT 279   BMET  Recent Labs  03/24/14 0945  NA 135*  K 4.0  CL 98  CO2 25  GLUCOSE 155*  BUN 12  CREATININE 0.77  CALCIUM 9.6     Studies/Results: Dg Cholangiogram Operative  03/25/2014   CLINICAL DATA:  Cholelithiasis  EXAM: INTRAOPERATIVE CHOLANGIOGRAM  TECHNIQUE: Cholangiographic images from the C-arm fluoroscopic device were submitted for interpretation post-operatively. Please see the procedural report for the amount of contrast and the fluoroscopy time utilized.  COMPARISON:  None.  FINDINGS: Contrast fills the biliary tree and duodenum without filling defects in the common bile duct.  IMPRESSION: Patent biliary tree.  No common bile duct stones.   Electronically Signed   By: Maryclare Bean M.D.   On: 03/25/2014 14:23    Anti-infectives: Anti-infectives    Start     Dose/Rate Route Frequency Ordered Stop   03/25/14 0749  ciprofloxacin (CIPRO) IVPB 400 mg     400 mg200 mL/hr over 60 Minutes Intravenous On call to O.R. 03/25/14 0749 03/25/14 1040      Assessment/Plan: s/p Procedure(s): LAPAROSCOPIC  CHOLECYSTECTOMY WITH INTRAOPERATIVE CHOLANGIOGRAM Doing well. Ready for discharge.   LOS: 1 day    Traevon Meiring T 03/26/2014

## 2014-03-26 NOTE — Progress Notes (Signed)
Utilization Review completed.  

## 2014-03-26 NOTE — Progress Notes (Signed)
Pt discharged to home. DC instructions given with spouse at bedside. Prescription x 1 given for pain med. No concerns voiced. Left unit in wheelchair pushed by nurse tech. Left in good condition. Vwilliams,rn.

## 2014-03-26 NOTE — Plan of Care (Signed)
Problem: Discharge Progression Outcomes Goal: Barriers To Progression Addressed/Resolved Outcome: Completed/Met Date Met:  03/26/14 Goal: Discharge plan in place and appropriate Outcome: Completed/Met Date Met:  03/26/14 Goal: Pain controlled with appropriate interventions Outcome: Completed/Met Date Met:  03/26/14 Goal: Hemodynamically stable Outcome: Completed/Met Date Met:  42/68/34 Goal: Complications resolved/controlled Outcome: Completed/Met Date Met:  03/26/14 Goal: Tolerating diet Outcome: Completed/Met Date Met:  03/26/14 Goal: Activity appropriate for discharge plan Outcome: Completed/Met Date Met:  03/26/14 Goal: Tubes and drains discontinued if indicated Outcome: Not Applicable Date Met:  19/62/22 Goal: Staples/sutures removed Outcome: Not Applicable Date Met:  97/98/92 Goal: Steri-Strips applied Outcome: Not Applicable Date Met:  11/94/17 Goal: Other Discharge Outcomes/Goals Outcome: Completed/Met Date Met:  03/26/14

## 2014-03-28 ENCOUNTER — Encounter (HOSPITAL_COMMUNITY): Payer: Self-pay | Admitting: Surgery

## 2014-03-29 NOTE — Discharge Summary (Signed)
  Physician Discharge Summary [facility]  Patient ID: Lorraine Buckley MRN: [id number] DOB/AGE: 12/31/1945 68 y.o.  Admit date: 03/25/2014 Discharge date: 03/26/2014   Admission Diagnoses:  Chronic cholecystitis, cholelithiasis  Discharge Diagnoses:  Principal Problem:   Cholelithiasis with cholecystitis   Discharged Condition: good  Hospital Course: Patient was admitted for observation following surgery.  Post op course was uncomplicated.  Pain was well controlled.  Tolerated diet.  Patient was prepared for discharge home on POD#1.  Consults: None  Treatments: surgery: lap chole with IOC  Discharge Exam: Blood pressure 135/71, pulse 68, temperature 98.6 F (37 C), temperature source Oral, resp. rate 18, height 5\' 7"  (1.702 m), weight 231 lb (104.781 kg), SpO2 96 %. See discharge note from Dr. Excell Seltzer.  Disposition: Home  Discharge Instructions    Discharge patient    Complete by:  As directed             Medication List    TAKE these medications        albuterol 108 (90 BASE) MCG/ACT inhaler  Commonly known as:  PROVENTIL HFA;VENTOLIN HFA  Inhale 2 puffs into the lungs every 4 (four) hours as needed. For breathing     amLODipine 2.5 MG tablet  Commonly known as:  NORVASC  Take 2.5 mg by mouth every morning.     budesonide 180 MCG/ACT inhaler  Commonly known as:  PULMICORT  Inhale 1 puff into the lungs 2 (two) times daily.     budesonide-formoterol 160-4.5 MCG/ACT inhaler  Commonly known as:  SYMBICORT  Inhale 1 puff into the lungs 2 (two) times daily.     cholecalciferol 1000 UNITS tablet  Commonly known as:  VITAMIN D  Take 1,000 Units by mouth daily.     fexofenadine 180 MG tablet  Commonly known as:  ALLEGRA  Take 180 mg by mouth daily.     fluticasone 50 MCG/ACT nasal spray  Commonly known as:  FLONASE  Place 1 spray into both nostrils daily.     HYDROcodone-acetaminophen 5-325 MG per tablet  Commonly known as:   NORCO/VICODIN  Take 1-2 tablets by mouth every 4 (four) hours as needed for moderate pain.     levothyroxine 100 MCG tablet  Commonly known as:  SYNTHROID, LEVOTHROID  Take 100 mcg by mouth daily before breakfast.     RESTASIS 0.05 % ophthalmic emulsion  Generic drug:  cycloSPORINE  Place 1 drop into both eyes daily.           Follow-up Information    Follow up with [name], MD. Schedule an appointment as soon as possible for a visit in 3 weeks.   Specialty:  General Surgery   Why:  For wound re-check   Contact information:   [place] [address] [phone]       [name], MD, [facility] Office: [phone]   Signed: [name] 03/29/2014, 2:41 PM

## 2014-03-31 ENCOUNTER — Ambulatory Visit
Admission: RE | Admit: 2014-03-31 | Discharge: 2014-03-31 | Disposition: A | Discharge status: Home / Self Care | Payer: Medicare Other | Source: Ambulatory Visit

## 2014-03-31 DIAGNOSIS — Z1231 Encounter for screening mammogram for malignant neoplasm of breast: Secondary | ICD-10-CM

## 2014-11-14 ENCOUNTER — Other Ambulatory Visit: Payer: Self-pay

## 2015-01-26 DIAGNOSIS — Z23 Encounter for immunization: Secondary | ICD-10-CM | POA: Diagnosis not present

## 2015-02-08 ENCOUNTER — Other Ambulatory Visit: Payer: Self-pay

## 2015-02-08 DIAGNOSIS — Z1231 Encounter for screening mammogram for malignant neoplasm of breast: Secondary | ICD-10-CM

## 2015-03-02 DIAGNOSIS — E559 Vitamin D deficiency, unspecified: Secondary | ICD-10-CM | POA: Diagnosis not present

## 2015-03-02 DIAGNOSIS — H6123 Impacted cerumen, bilateral: Secondary | ICD-10-CM | POA: Diagnosis not present

## 2015-03-02 DIAGNOSIS — R7301 Impaired fasting glucose: Secondary | ICD-10-CM | POA: Diagnosis not present

## 2015-03-02 DIAGNOSIS — E039 Hypothyroidism, unspecified: Secondary | ICD-10-CM | POA: Diagnosis not present

## 2015-03-02 DIAGNOSIS — Z0000 Encounter for general adult medical examination without abnormal findings: Secondary | ICD-10-CM | POA: Diagnosis not present

## 2015-03-02 DIAGNOSIS — E785 Hyperlipidemia, unspecified: Secondary | ICD-10-CM | POA: Diagnosis not present

## 2015-03-02 DIAGNOSIS — R8290 Unspecified abnormal findings in urine: Secondary | ICD-10-CM | POA: Diagnosis not present

## 2015-03-02 DIAGNOSIS — Z79899 Other long term (current) drug therapy: Secondary | ICD-10-CM | POA: Diagnosis not present

## 2015-03-02 DIAGNOSIS — J4530 Mild persistent asthma, uncomplicated: Secondary | ICD-10-CM | POA: Diagnosis not present

## 2015-03-15 DIAGNOSIS — L814 Other melanin hyperpigmentation: Secondary | ICD-10-CM | POA: Diagnosis not present

## 2015-03-15 DIAGNOSIS — D225 Melanocytic nevi of trunk: Secondary | ICD-10-CM | POA: Diagnosis not present

## 2015-03-15 DIAGNOSIS — L820 Inflamed seborrheic keratosis: Secondary | ICD-10-CM | POA: Diagnosis not present

## 2015-03-15 DIAGNOSIS — L821 Other seborrheic keratosis: Secondary | ICD-10-CM | POA: Diagnosis not present

## 2015-03-15 DIAGNOSIS — L304 Erythema intertrigo: Secondary | ICD-10-CM | POA: Diagnosis not present

## 2015-03-15 DIAGNOSIS — L918 Other hypertrophic disorders of the skin: Secondary | ICD-10-CM | POA: Diagnosis not present

## 2015-03-15 DIAGNOSIS — D1801 Hemangioma of skin and subcutaneous tissue: Secondary | ICD-10-CM | POA: Diagnosis not present

## 2015-03-21 DIAGNOSIS — H1789 Other corneal scars and opacities: Secondary | ICD-10-CM | POA: Diagnosis not present

## 2015-03-21 DIAGNOSIS — H2513 Age-related nuclear cataract, bilateral: Secondary | ICD-10-CM | POA: Diagnosis not present

## 2015-04-03 ENCOUNTER — Ambulatory Visit
Admission: RE | Admit: 2015-04-03 | Discharge: 2015-04-03 | Disposition: A | Discharge status: Home / Self Care | Payer: Medicare Other | Source: Ambulatory Visit

## 2015-04-03 DIAGNOSIS — Z1231 Encounter for screening mammogram for malignant neoplasm of breast: Secondary | ICD-10-CM

## 2015-05-25 DIAGNOSIS — J4530 Mild persistent asthma, uncomplicated: Secondary | ICD-10-CM | POA: Diagnosis not present

## 2015-05-30 DIAGNOSIS — B029 Zoster without complications: Secondary | ICD-10-CM | POA: Diagnosis not present

## 2015-06-12 DIAGNOSIS — H6522 Chronic serous otitis media, left ear: Secondary | ICD-10-CM | POA: Diagnosis not present

## 2015-06-12 DIAGNOSIS — J410 Simple chronic bronchitis: Secondary | ICD-10-CM | POA: Diagnosis not present

## 2015-06-12 DIAGNOSIS — J320 Chronic maxillary sinusitis: Secondary | ICD-10-CM | POA: Diagnosis not present

## 2015-06-12 DIAGNOSIS — H2513 Age-related nuclear cataract, bilateral: Secondary | ICD-10-CM | POA: Diagnosis not present

## 2015-06-12 DIAGNOSIS — B029 Zoster without complications: Secondary | ICD-10-CM | POA: Diagnosis not present

## 2015-06-12 DIAGNOSIS — B0239 Other herpes zoster eye disease: Secondary | ICD-10-CM | POA: Diagnosis not present

## 2015-06-12 DIAGNOSIS — J040 Acute laryngitis: Secondary | ICD-10-CM | POA: Diagnosis not present

## 2015-06-12 DIAGNOSIS — J322 Chronic ethmoidal sinusitis: Secondary | ICD-10-CM | POA: Diagnosis not present

## 2015-06-12 DIAGNOSIS — Z9889 Other specified postprocedural states: Secondary | ICD-10-CM | POA: Diagnosis not present

## 2015-06-12 DIAGNOSIS — J3081 Allergic rhinitis due to animal (cat) (dog) hair and dander: Secondary | ICD-10-CM | POA: Diagnosis not present

## 2015-06-12 DIAGNOSIS — H9072 Mixed conductive and sensorineural hearing loss, unilateral, left ear, with unrestricted hearing on the contralateral side: Secondary | ICD-10-CM | POA: Diagnosis not present

## 2015-06-20 DIAGNOSIS — J320 Chronic maxillary sinusitis: Secondary | ICD-10-CM | POA: Diagnosis not present

## 2015-06-20 DIAGNOSIS — J322 Chronic ethmoidal sinusitis: Secondary | ICD-10-CM | POA: Diagnosis not present

## 2015-07-05 DIAGNOSIS — J322 Chronic ethmoidal sinusitis: Secondary | ICD-10-CM | POA: Diagnosis not present

## 2015-07-05 DIAGNOSIS — J320 Chronic maxillary sinusitis: Secondary | ICD-10-CM | POA: Diagnosis not present

## 2015-07-05 DIAGNOSIS — J040 Acute laryngitis: Secondary | ICD-10-CM | POA: Diagnosis not present

## 2015-07-25 DIAGNOSIS — H2513 Age-related nuclear cataract, bilateral: Secondary | ICD-10-CM | POA: Diagnosis not present

## 2015-07-25 DIAGNOSIS — B0233 Zoster keratitis: Secondary | ICD-10-CM | POA: Diagnosis not present

## 2015-07-25 DIAGNOSIS — Z9889 Other specified postprocedural states: Secondary | ICD-10-CM | POA: Diagnosis not present

## 2015-08-23 ENCOUNTER — Other Ambulatory Visit: Payer: Self-pay | Admitting: Family Medicine

## 2015-08-23 ENCOUNTER — Other Ambulatory Visit: Payer: Self-pay

## 2015-08-23 DIAGNOSIS — R938 Abnormal findings on diagnostic imaging of other specified body structures: Secondary | ICD-10-CM | POA: Diagnosis not present

## 2015-08-23 DIAGNOSIS — R9389 Abnormal findings on diagnostic imaging of other specified body structures: Secondary | ICD-10-CM

## 2015-08-23 DIAGNOSIS — R7301 Impaired fasting glucose: Secondary | ICD-10-CM | POA: Diagnosis not present

## 2015-08-29 ENCOUNTER — Ambulatory Visit
Admission: RE | Admit: 2015-08-29 | Discharge: 2015-08-29 | Disposition: A | Discharge status: Home / Self Care | Payer: Medicare Other | Source: Ambulatory Visit | Attending: Family Medicine | Admitting: Family Medicine

## 2015-08-29 DIAGNOSIS — Z9889 Other specified postprocedural states: Secondary | ICD-10-CM | POA: Diagnosis not present

## 2015-08-29 DIAGNOSIS — H2513 Age-related nuclear cataract, bilateral: Secondary | ICD-10-CM | POA: Diagnosis not present

## 2015-08-29 DIAGNOSIS — R9389 Abnormal findings on diagnostic imaging of other specified body structures: Secondary | ICD-10-CM

## 2015-08-29 DIAGNOSIS — E042 Nontoxic multinodular goiter: Secondary | ICD-10-CM | POA: Diagnosis not present

## 2015-08-29 DIAGNOSIS — B0233 Zoster keratitis: Secondary | ICD-10-CM | POA: Diagnosis not present

## 2016-02-05 DIAGNOSIS — Z23 Encounter for immunization: Secondary | ICD-10-CM | POA: Diagnosis not present

## 2016-03-06 ENCOUNTER — Other Ambulatory Visit: Payer: Self-pay | Admitting: Family Medicine

## 2016-03-06 DIAGNOSIS — Z1231 Encounter for screening mammogram for malignant neoplasm of breast: Secondary | ICD-10-CM

## 2016-03-11 DIAGNOSIS — L814 Other melanin hyperpigmentation: Secondary | ICD-10-CM | POA: Diagnosis not present

## 2016-03-11 DIAGNOSIS — D1801 Hemangioma of skin and subcutaneous tissue: Secondary | ICD-10-CM | POA: Diagnosis not present

## 2016-03-11 DIAGNOSIS — D225 Melanocytic nevi of trunk: Secondary | ICD-10-CM | POA: Diagnosis not present

## 2016-03-11 DIAGNOSIS — L821 Other seborrheic keratosis: Secondary | ICD-10-CM | POA: Diagnosis not present

## 2016-03-22 DIAGNOSIS — E785 Hyperlipidemia, unspecified: Secondary | ICD-10-CM | POA: Diagnosis not present

## 2016-03-22 DIAGNOSIS — E041 Nontoxic single thyroid nodule: Secondary | ICD-10-CM | POA: Diagnosis not present

## 2016-03-22 DIAGNOSIS — J4530 Mild persistent asthma, uncomplicated: Secondary | ICD-10-CM | POA: Diagnosis not present

## 2016-03-22 DIAGNOSIS — J309 Allergic rhinitis, unspecified: Secondary | ICD-10-CM | POA: Diagnosis not present

## 2016-03-22 DIAGNOSIS — R7301 Impaired fasting glucose: Secondary | ICD-10-CM | POA: Diagnosis not present

## 2016-03-22 DIAGNOSIS — Z0000 Encounter for general adult medical examination without abnormal findings: Secondary | ICD-10-CM | POA: Diagnosis not present

## 2016-03-22 DIAGNOSIS — E559 Vitamin D deficiency, unspecified: Secondary | ICD-10-CM | POA: Diagnosis not present

## 2016-03-22 DIAGNOSIS — Z79899 Other long term (current) drug therapy: Secondary | ICD-10-CM | POA: Diagnosis not present

## 2016-03-22 DIAGNOSIS — R8290 Unspecified abnormal findings in urine: Secondary | ICD-10-CM | POA: Diagnosis not present

## 2016-03-27 DIAGNOSIS — E042 Nontoxic multinodular goiter: Secondary | ICD-10-CM | POA: Diagnosis not present

## 2016-04-12 IMAGING — US US ABDOMEN LIMITED
1 series · 14 of 25 positions shown · non-contrast
Comparison: None.

CLINICAL DATA: Acute epigastric abdominal pain.

EXAM:
US ABDOMEN LIMITED - RIGHT UPPER QUADRANT

[Series 1: us abdomen limited · 0.37mm/px · 14 of 36 slices shown]
[im 1/36]
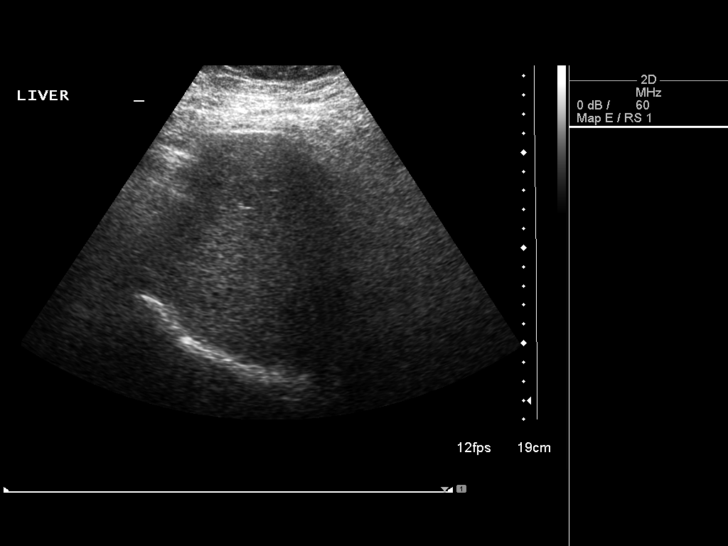
[im 3/36]
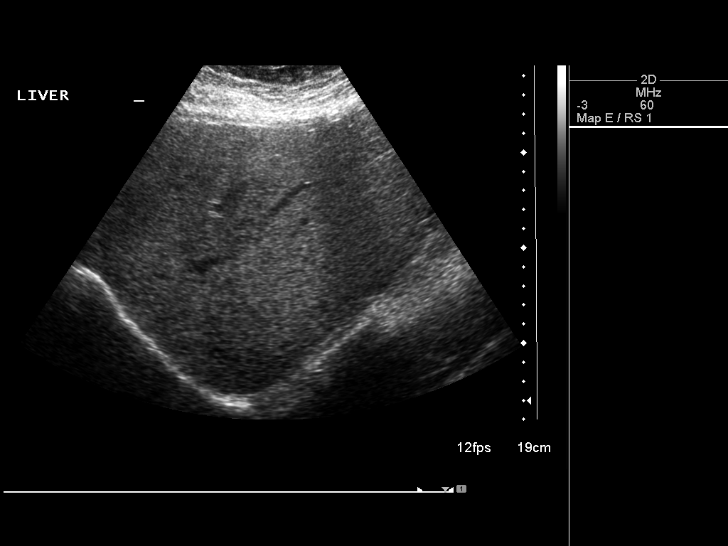
[im 6/36]
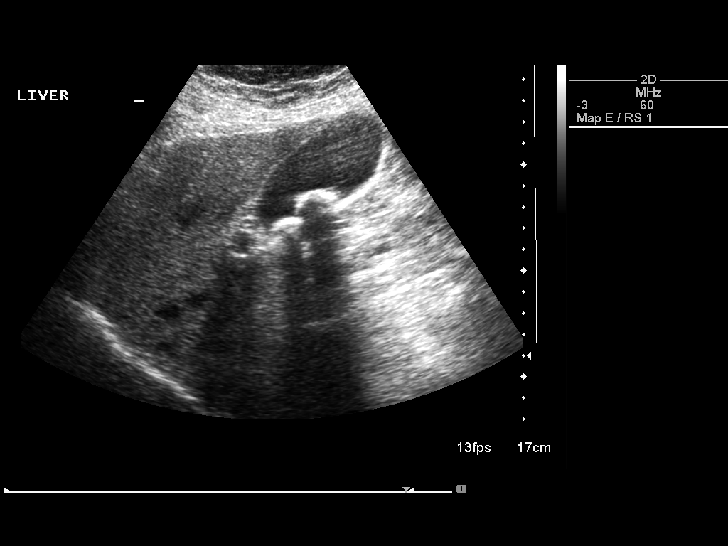
[im 9/36]
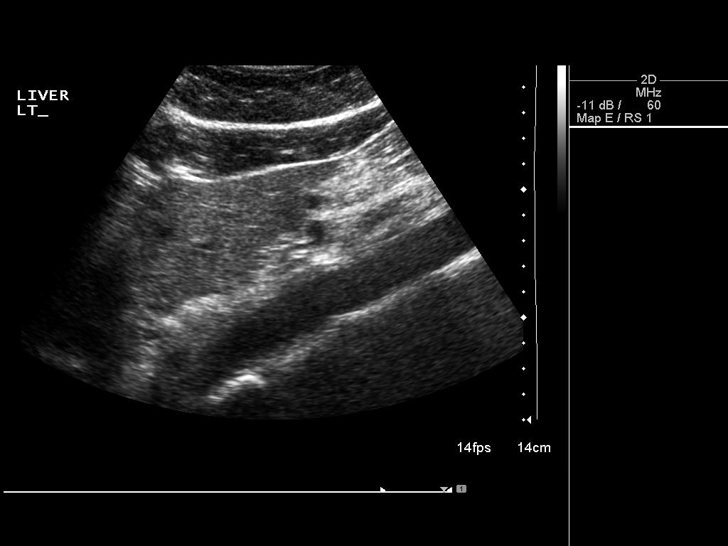
[im 12/36]
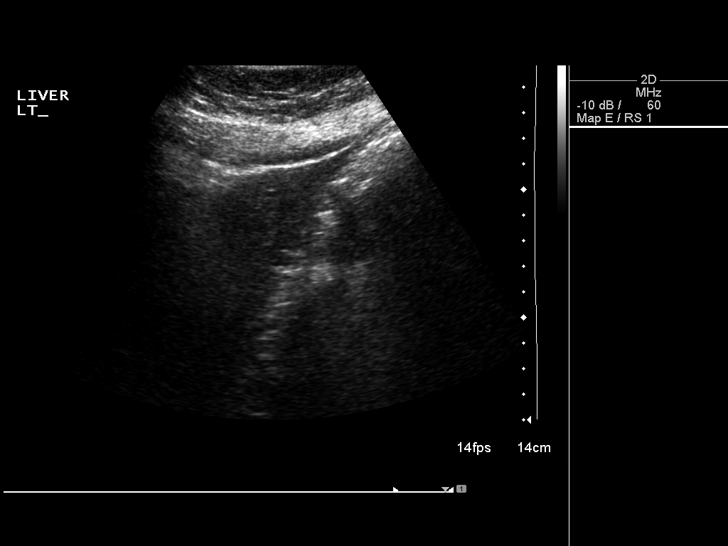
[im 14/36]
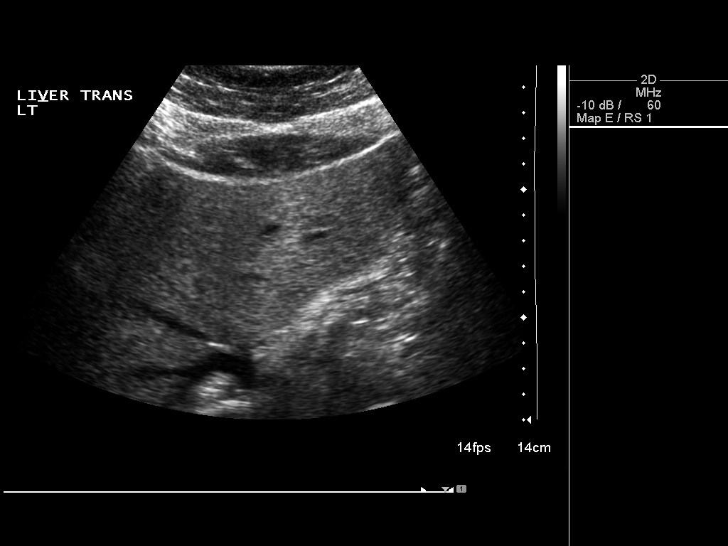
[im 17/36]
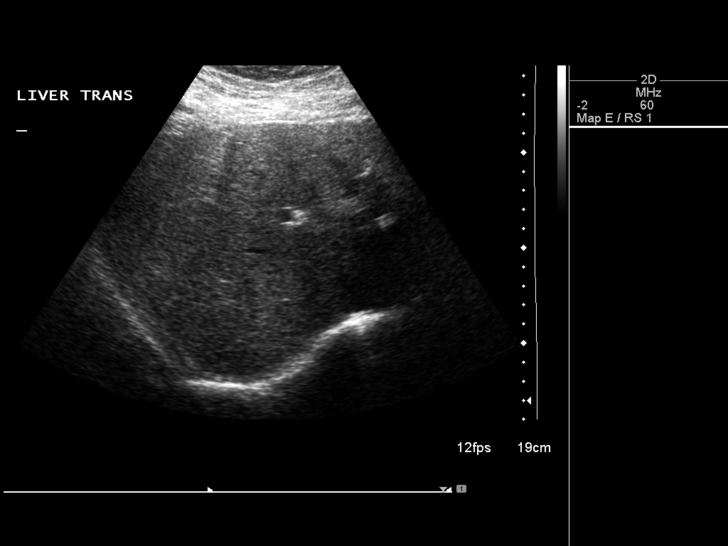
[im 19/36]
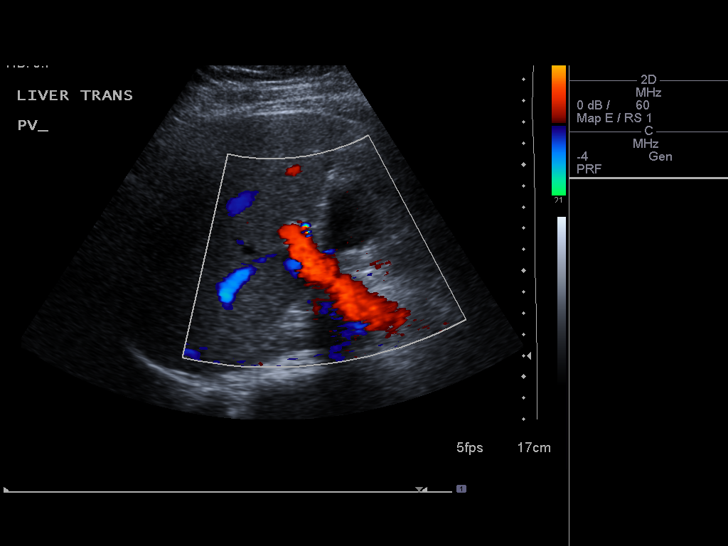
[im 22/36]
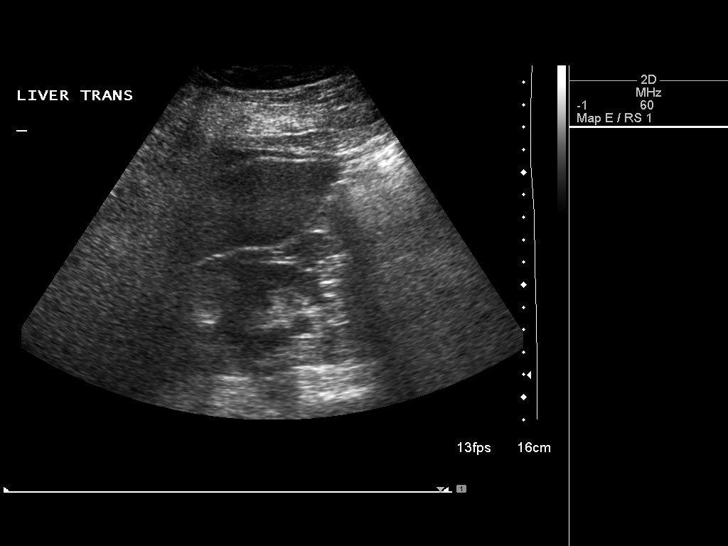
[im 24/36]
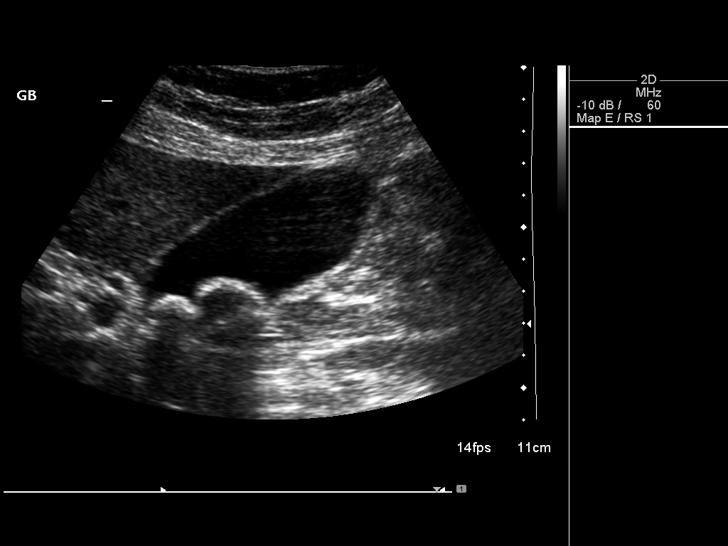
[im 27/36]
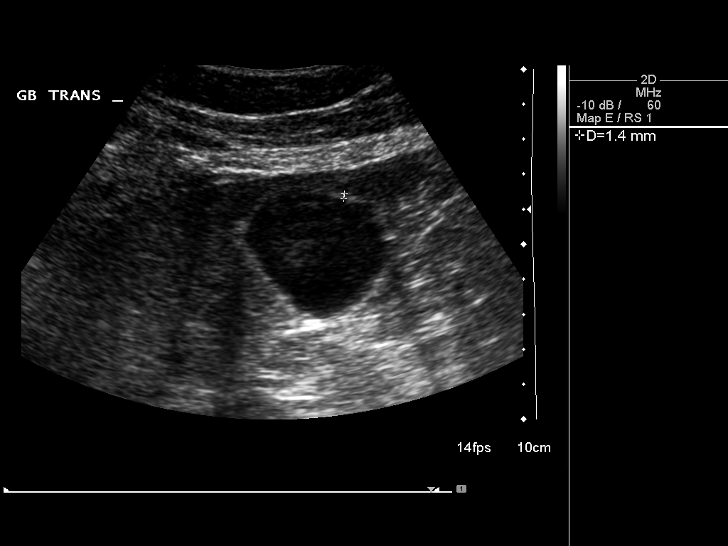
[im 30/36]
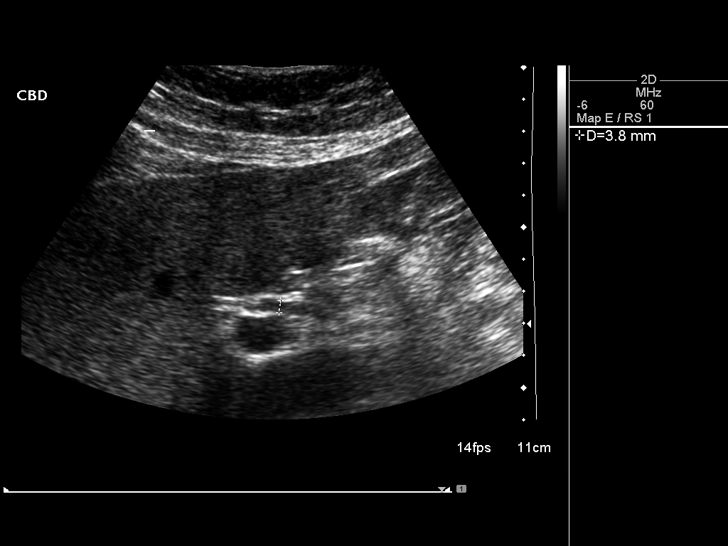
[im 33/36]
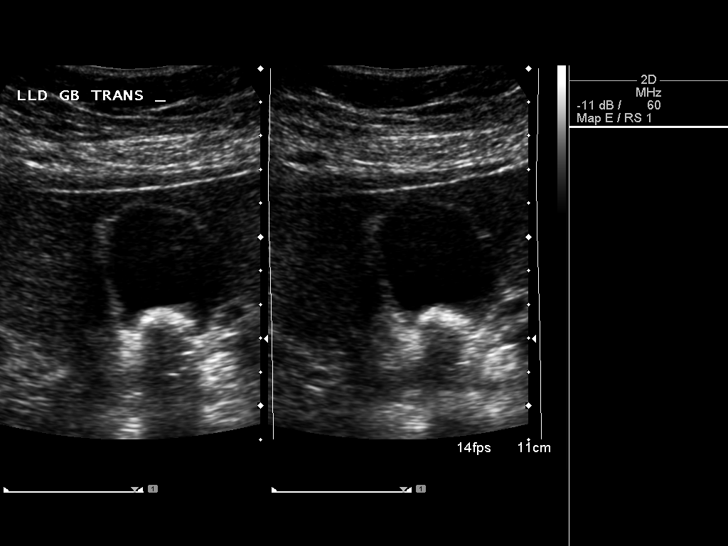
[im 36/36]
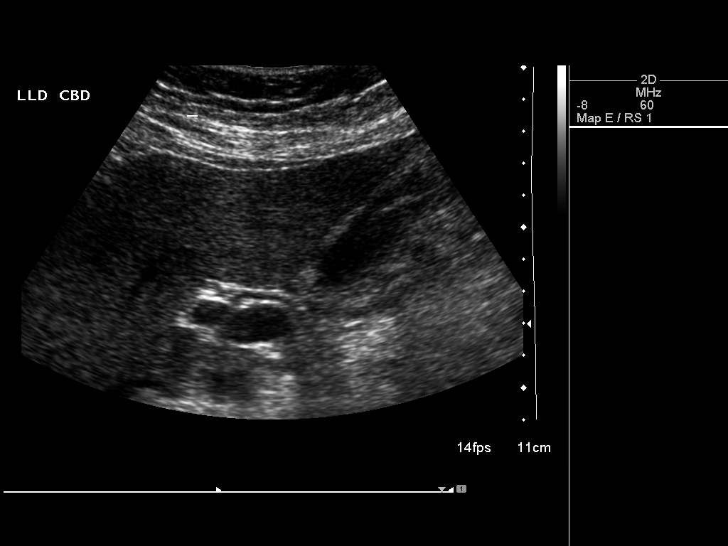

[14 of 25 positions shown; findings below may reference images not displayed]

FINDINGS: Gallbladder:

Multiple gallstones are noted without gallbladder wall thickening or
pericholecystic fluid. No sonographic Murphy's sign is noted.

Common bile duct:

Diameter: 3.8 mm which is within normal limits.

Liver:

No focal lesion identified. Mildly increased echogenicity is noted
suggesting fatty infiltration.
IMPRESSION: Mild fatty infiltration of the liver. Cholelithiasis without
evidence of cholecystitis.

## 2016-04-16 ENCOUNTER — Ambulatory Visit
Admission: RE | Admit: 2016-04-16 | Discharge: 2016-04-16 | Disposition: A | Discharge status: Home / Self Care | Payer: Medicare Other | Source: Ambulatory Visit | Attending: Family Medicine | Admitting: Family Medicine

## 2016-04-16 DIAGNOSIS — Z1231 Encounter for screening mammogram for malignant neoplasm of breast: Secondary | ICD-10-CM

## 2016-05-06 DIAGNOSIS — H40003 Preglaucoma, unspecified, bilateral: Secondary | ICD-10-CM | POA: Diagnosis not present

## 2016-08-06 ENCOUNTER — Other Ambulatory Visit: Payer: Self-pay | Admitting: Surgery

## 2016-08-06 DIAGNOSIS — E041 Nontoxic single thyroid nodule: Secondary | ICD-10-CM

## 2016-08-07 ENCOUNTER — Ambulatory Visit
Admission: RE | Admit: 2016-08-07 | Discharge: 2016-08-07 | Disposition: A | Discharge status: Home / Self Care | Payer: Medicare Other | Source: Ambulatory Visit | Attending: Surgery | Admitting: Surgery

## 2016-08-07 DIAGNOSIS — E041 Nontoxic single thyroid nodule: Secondary | ICD-10-CM | POA: Diagnosis not present

## 2016-10-07 DIAGNOSIS — E042 Nontoxic multinodular goiter: Secondary | ICD-10-CM | POA: Diagnosis not present

## 2017-01-16 DIAGNOSIS — Z23 Encounter for immunization: Secondary | ICD-10-CM | POA: Diagnosis not present

## 2017-03-20 ENCOUNTER — Other Ambulatory Visit: Payer: Self-pay | Admitting: Family Medicine

## 2017-03-20 DIAGNOSIS — Z1231 Encounter for screening mammogram for malignant neoplasm of breast: Secondary | ICD-10-CM

## 2017-04-16 ENCOUNTER — Ambulatory Visit: Payer: Medicare Other

## 2017-04-17 ENCOUNTER — Ambulatory Visit
Admission: RE | Admit: 2017-04-17 | Discharge: 2017-04-17 | Disposition: A | Discharge status: Home / Self Care | Payer: Medicare Other | Source: Ambulatory Visit | Attending: Family Medicine | Admitting: Family Medicine

## 2017-04-17 DIAGNOSIS — Z1231 Encounter for screening mammogram for malignant neoplasm of breast: Secondary | ICD-10-CM | POA: Diagnosis not present

## 2017-04-24 DIAGNOSIS — J309 Allergic rhinitis, unspecified: Secondary | ICD-10-CM | POA: Diagnosis not present

## 2017-04-24 DIAGNOSIS — E669 Obesity, unspecified: Secondary | ICD-10-CM | POA: Diagnosis not present

## 2017-04-24 DIAGNOSIS — R7301 Impaired fasting glucose: Secondary | ICD-10-CM | POA: Diagnosis not present

## 2017-04-24 DIAGNOSIS — E559 Vitamin D deficiency, unspecified: Secondary | ICD-10-CM | POA: Diagnosis not present

## 2017-04-24 DIAGNOSIS — J4530 Mild persistent asthma, uncomplicated: Secondary | ICD-10-CM | POA: Diagnosis not present

## 2017-04-24 DIAGNOSIS — Z0000 Encounter for general adult medical examination without abnormal findings: Secondary | ICD-10-CM | POA: Diagnosis not present

## 2017-04-24 DIAGNOSIS — E785 Hyperlipidemia, unspecified: Secondary | ICD-10-CM | POA: Diagnosis not present

## 2017-04-24 DIAGNOSIS — E042 Nontoxic multinodular goiter: Secondary | ICD-10-CM | POA: Diagnosis not present

## 2017-04-24 DIAGNOSIS — Z6835 Body mass index (BMI) 35.0-35.9, adult: Secondary | ICD-10-CM | POA: Diagnosis not present

## 2017-05-06 DIAGNOSIS — H40013 Open angle with borderline findings, low risk, bilateral: Secondary | ICD-10-CM | POA: Diagnosis not present

## 2017-08-12 DIAGNOSIS — R040 Epistaxis: Secondary | ICD-10-CM | POA: Diagnosis not present

## 2017-08-12 DIAGNOSIS — J45909 Unspecified asthma, uncomplicated: Secondary | ICD-10-CM | POA: Insufficient documentation

## 2017-08-12 DIAGNOSIS — I10 Essential (primary) hypertension: Secondary | ICD-10-CM | POA: Insufficient documentation

## 2017-08-12 DIAGNOSIS — E039 Hypothyroidism, unspecified: Secondary | ICD-10-CM | POA: Diagnosis not present

## 2017-08-12 DIAGNOSIS — Z79899 Other long term (current) drug therapy: Secondary | ICD-10-CM | POA: Diagnosis not present

## 2017-08-13 ENCOUNTER — Other Ambulatory Visit: Payer: Self-pay

## 2017-08-13 ENCOUNTER — Encounter (HOSPITAL_COMMUNITY): Payer: Self-pay

## 2017-08-13 ENCOUNTER — Emergency Department (HOSPITAL_COMMUNITY)
Admission: EM | Admit: 2017-08-13 | Discharge: 2017-08-13 | Disposition: A | Discharge status: Home / Self Care | Payer: Medicare Other | Attending: Emergency Medicine | Admitting: Emergency Medicine

## 2017-08-13 DIAGNOSIS — R040 Epistaxis: Secondary | ICD-10-CM | POA: Diagnosis not present

## 2017-08-13 MED ORDER — LIDOCAINE VISCOUS 2 % MT SOLN
15.0000 mL | Freq: Once | OROMUCOSAL | Status: AC
  Administered 2017-08-13: 15 mL via OROMUCOSAL
  Filled 2017-08-13: qty 15

## 2017-08-13 MED ORDER — TRANEXAMIC ACID 1000 MG/10ML IV SOLN
500.0000 mg | Freq: Once | INTRAVENOUS | Status: AC
  Administered 2017-08-13: 500 mg via TOPICAL
  Filled 2017-08-13: qty 10

## 2017-08-13 MED ORDER — OXYMETAZOLINE HCL 0.05 % NA SOLN
2.0000 | Freq: Once | NASAL | Status: AC
  Administered 2017-08-13: 2 via NASAL

## 2017-08-13 MED ORDER — OXYMETAZOLINE HCL 0.05 % NA SOLN
1.0000 | Freq: Once | NASAL | Status: DC
  Filled 2017-08-13: qty 15

## 2017-08-13 MED ORDER — TRAMADOL HCL 50 MG PO TABS: 50.0000 mg | ORAL_TABLET | Freq: Four times a day (QID) | ORAL | 0 refills | Status: DC | PRN

## 2017-08-13 MED ORDER — SULFAMETHOXAZOLE-TRIMETHOPRIM 800-160 MG PO TABS: 1.0000 | ORAL_TABLET | Freq: Two times a day (BID) | ORAL | 0 refills | Status: DC

## 2017-08-13 NOTE — ED Provider Notes (Signed)
[facility] DEPT Provider Note   CSN: [id number] Arrival date & time: 08/12/17  2340  Time seen 12:10 AM   History   Chief Complaint Chief Complaint  Patient presents with  . Epistaxis    HPI Lorraine Buckley is a 72 y.o. female.  HPI patient reports about 10 AM she had acute onset of bleeding from the left side of her nose.  She denies any sneezing or URI symptoms prior.  She states shortly afterwards the blood started coming out both sides of her nose.  She denies being on any blood thinners including aspirin although she does states she has been taking an Aleve and ibuprofen recently.  She states she had a nosebleed before that resolved without intervention at that time she was also taking Aleve.  She states she feels like the blood is going down the back of her throat.  PCP Hulan Fess, MD   Past Medical History:  Diagnosis Date  . Arthritis    FINGERS AND NECK HURT  . Asthma    ALLERGY INDUCED ASTHMA  . Gallstones    ABD PAIN, BAD GAS. FREQ STOOLS  . Heart murmur    PT NOT SURE IF MURMUR STILL HEARD - DOES NOT CAUSE ANY SYMPTOMS  . Hypertension   . Hypothyroidism   . Pneumonia    ABOUT FEB 2013  . PONV (postoperative nausea and vomiting)   . Shortness of breath dyspnea    OCCAS SOB  . Thyroid disease     Patient Active Problem List   Diagnosis Date Noted  . Cholelithiasis with cholecystitis 03/21/2014  . 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 Flu-like symptoms 06/25/2011  . Hypothyroidism 06/25/2011  . Asthma 06/25/2011  . Acute bronchitis 06/25/2011    Past Surgical History:  Procedure Laterality Date  . ABDOMINAL HYSTERECTOMY    . BUNIONECTOMY     RIGHT BIG TOE  . CHOLECYSTECTOMY N/A 03/25/2014   Procedure: LAPAROSCOPIC CHOLECYSTECTOMY WITH INTRAOPERATIVE CHOLANGIOGRAM;  Surgeon: Armandina Gemma, MD;  Location: [facility] ORS;  Service: General;  Laterality: N/A;  . EYE SURGERY     LASIK EYE SURGERY  . TUBAL LIGATION        OB History   None      Home Medications    Prior to Admission medications   Medication Sig Start Date End Date Taking? Authorizing Provider  albuterol (PROVENTIL HFA;VENTOLIN HFA) 108 (90 BASE) MCG/ACT inhaler Inhale 2 puffs into the lungs every 4 (four) hours as needed. For breathing    [provider]  amLODipine (NORVASC) 2.5 MG tablet Take 2.5 mg by mouth every morning.     [provider]  budesonide (PULMICORT) 180 MCG/ACT inhaler Inhale 1 puff into the lungs 2 (two) times daily.    [provider]  budesonide-formoterol (SYMBICORT) 160-4.5 MCG/ACT inhaler Inhale 1 puff into the lungs 2 (two) times daily.    [provider]  cholecalciferol (VITAMIN D) 1000 UNITS tablet Take 1,000 Units by mouth daily.    [provider]  fexofenadine (ALLEGRA) 180 MG tablet Take 180 mg by mouth daily.    [provider]  fluticasone (FLONASE) 50 MCG/ACT nasal spray Place 1 spray into both nostrils daily.  09/28/12   [provider]  HYDROcodone-acetaminophen (NORCO/VICODIN) 5-325 MG per tablet Take 1-2 tablets by mouth every 4 (four) hours as needed for moderate pain. 03/25/14   Armandina Gemma, MD  levothyroxine (SYNTHROID, LEVOTHROID) 100 MCG tablet Take 100 mcg by mouth daily before  breakfast.     [provider]  RESTASIS 0.05 % ophthalmic emulsion Place 1 drop into both eyes daily.  09/30/11   [provider]  sulfamethoxazole-trimethoprim (BACTRIM DS,SEPTRA DS) 800-160 MG tablet Take 1 tablet by mouth 2 (two) times daily. 08/13/17   Rolland Porter, MD  traMADol (ULTRAM) 50 MG tablet Take 1 tablet (50 mg total) by mouth every 6 (six) hours as needed. 08/13/17   Rolland Porter, MD    Family History Family History  Problem Relation Age of Onset  . Asthma Mother   . Hypertension Mother     Social History Social History   Tobacco Use  . Smoking status: Never Smoker  . Smokeless tobacco: Never Used  Substance Use  Topics  . Alcohol use: No  . Drug use: No  lives at home  Lives with spouse   Allergies   Penicillins   Review of Systems Review of Systems  All other systems reviewed and are negative.    Physical Exam Updated Vital Signs BP 140/87   Pulse 83   Temp 98 F (36.7 C) (Oral)   Resp 16   SpO2 95%   Physical Exam  Constitutional: She is oriented to person, place, and time. She appears well-developed and well-nourished. No distress.  HENT:  Head: Normocephalic and atraumatic.  Right Ear: External ear normal.  Left Ear: External ear normal.  Mouth/Throat: Oropharynx is clear and moist.  Patient is holding on washcloth up to her nose with a large area of blood on the washcloth.  When she removes the washcloth there were clots hanging out of both nostrils.  These were removed.  The bleeding does appear to be worse on the left.  Eyes: Pupils are equal, round, and reactive to light. Conjunctivae and EOM are normal.  Neck: Normal range of motion.  Cardiovascular: Normal rate.  Pulmonary/Chest: Effort normal. No respiratory distress.  Musculoskeletal: Normal range of motion.  Neurological: She is alert and oriented to person, place, and time. A cranial nerve deficit is present.  Skin: Skin is warm and dry. No pallor.  Psychiatric: She has a normal mood and affect. Her behavior is normal. Thought content normal.     ED Treatments / Results  Labs (all labs ordered are listed, but only abnormal results are displayed) Labs Reviewed - No data to display  EKG None  Radiology No results found.  Procedures .Epistaxis Management Date/Time: 08/13/2017 4:11 AM Performed by: Rolland Porter, MD Authorized by: Rolland Porter, MD   Consent:    Consent obtained:  Verbal   Consent given by:  Patient   Risks discussed:  Bleeding Anesthesia (see MAR for exact dosages):    Anesthesia method:  Topical application   Topical anesthetic:  Lidocaine gel Procedure details:    Treatment site:  L  anterior and L posterior   Treatment method:  Merocel sponge   Treatment complexity:  Limited   Treatment episode: recurring   Post-procedure details:    Assessment:  Bleeding decreased   Patient tolerance of procedure:  Tolerated well, no immediate complications Comments:     TXA was squirted on the merocel 10 cm sponge   (including critical care time)  Medications Ordered in ED Medications  oxymetazoline (AFRIN) 0.05 % nasal spray 1 spray (0 sprays Each Nare Hold 08/13/17 0005)  tranexamic acid (CYKLOKAPRON) injection 500 mg (500 mg Topical Given 08/13/17 0043)  lidocaine (XYLOCAINE) 2 % viscous mouth solution 15 mL (15 mLs Mouth/Throat Given 08/13/17 0042)  oxymetazoline (  AFRIN) 0.05 % nasal spray 2 spray (2 sprays Right Nare Given 08/13/17 0255)     Initial Impression / Assessment and Plan / ED Course  I have reviewed the triage vital signs and the nursing notes.  Pertinent labs & imaging results that were available during my care of the patient were reviewed by me and considered in my medical decision making (see chart for details).     I used TXA on a Merocel sponge with Viscous Xylocaine.  However the first sponge which was 10 cm got too soft to quickly and I was unable to advance it far enough.  I put in a second 1 and got it in good position.  This greatly reduce the amount of bleeding.  She was noted to be spitting up white foamy stuff that sometimes had a pink tinge.  She would everyone small have a little bit of blood drip out of her left nostril.  1 AM patient states she feels like she has a blood clot in her right nostril.  Afrin spray was placed in her right nostril.  I did not want to have the pack both sides of her nose.  Recheck at 3:20 AM patient did have one episode of vomiting, this was expected with the amount of blood she was swallowing.  She states her stomach feels better.  She now has rare blood coming out of the right side.  Her Merocel sponges soaked with  blood.  Her blood pressure has improved to 140/87 without treatment.  She has seen Dr. Ernesto Rutherford, ENT in the past.  We discussed she would need to leave the sponge in for 5 days, she should call the office to get a follow-up appointment with ENT on April 1.  She was discharged home with antibiotics and something for pain.  Review of the [place] shows no entries.  Final Clinical Impressions(s) / ED Diagnoses   Final diagnoses:  Epistaxis    ED Discharge Orders        Ordered    sulfamethoxazole-trimethoprim (BACTRIM DS,SEPTRA DS) 800-160 MG tablet  2 times daily     08/13/17 0409    traMADol (ULTRAM) 50 MG tablet  Every 6 hours PRN     08/13/17 0409      Plan discharge  Rolland Porter, MD, Barbette Or, MD 08/13/17 [id number]

## 2017-08-13 NOTE — ED Triage Notes (Signed)
Pt BIB with family c/o epistaxis that started about 1.5 hours ago. She has saturated 2 washcloths. She used Afrin at home without relief. Hx of same. A&Ox4. Ambulatory.

## 2017-08-13 NOTE — Discharge Instructions (Signed)
Please call the ENT office to get a follow up appointment on April 1st to have your nasal sponge removed. Take the antibiotic to prevent a sinus infection from the sponge blocking the sinus cavities. Take the tramadol with acetaminophen 650 mg 4 times a day for pain. Use the afrin nasal spray in your right nostril if you start to get rebleeding. If that doesn't stop the bleeding, return to the ED.

## 2017-08-18 DIAGNOSIS — R040 Epistaxis: Secondary | ICD-10-CM | POA: Diagnosis not present

## 2017-08-18 DIAGNOSIS — H6123 Impacted cerumen, bilateral: Secondary | ICD-10-CM | POA: Diagnosis not present

## 2017-09-11 DIAGNOSIS — H9313 Tinnitus, bilateral: Secondary | ICD-10-CM | POA: Diagnosis not present

## 2017-09-11 DIAGNOSIS — J301 Allergic rhinitis due to pollen: Secondary | ICD-10-CM | POA: Diagnosis not present

## 2017-09-11 DIAGNOSIS — R040 Epistaxis: Secondary | ICD-10-CM | POA: Diagnosis not present

## 2017-09-11 DIAGNOSIS — H903 Sensorineural hearing loss, bilateral: Secondary | ICD-10-CM | POA: Diagnosis not present

## 2017-09-11 DIAGNOSIS — J3081 Allergic rhinitis due to animal (cat) (dog) hair and dander: Secondary | ICD-10-CM | POA: Diagnosis not present

## 2017-09-11 DIAGNOSIS — J330 Polyp of nasal cavity: Secondary | ICD-10-CM | POA: Diagnosis not present

## 2017-10-14 ENCOUNTER — Ambulatory Visit (INDEPENDENT_AMBULATORY_CARE_PROVIDER_SITE_OTHER): Payer: Medicare Other | Admitting: Allergy and Immunology

## 2017-10-14 ENCOUNTER — Encounter: Payer: Self-pay | Admitting: Allergy and Immunology

## 2017-10-14 ENCOUNTER — Other Ambulatory Visit: Payer: Self-pay | Admitting: Allergy and Immunology

## 2017-10-14 VITALS — Ht 66.69 in | Wt 232.2 lb

## 2017-10-14 DIAGNOSIS — H04123 Dry eye syndrome of bilateral lacrimal glands: Secondary | ICD-10-CM | POA: Diagnosis not present

## 2017-10-14 DIAGNOSIS — R040 Epistaxis: Secondary | ICD-10-CM | POA: Diagnosis not present

## 2017-10-14 DIAGNOSIS — J4540 Moderate persistent asthma, uncomplicated: Secondary | ICD-10-CM

## 2017-10-14 DIAGNOSIS — J3089 Other allergic rhinitis: Secondary | ICD-10-CM

## 2017-10-14 MED ORDER — OLOPATADINE HCL 0.1 % OP SOLN: 1.0000 [drp] | Freq: Two times a day (BID) | OPHTHALMIC | 2 refills | Status: DC

## 2017-10-14 MED ORDER — AZELASTINE HCL 0.1 % NA SOLN: NASAL | 2 refills | Status: DC

## 2017-10-14 MED ORDER — MONTELUKAST SODIUM 10 MG PO TABS: 10.0000 mg | ORAL_TABLET | Freq: Every day | ORAL | 2 refills | Status: DC

## 2017-10-14 NOTE — Progress Notes (Signed)
NEW PATIENT NOTE  Referring Provider: No ref. provider found Primary Provider: Hulan Fess, MD Date of office visit: 10/14/2017    Subjective:   Chief Complaint:  Lorraine Buckley (DOB: Dec 04, 1945) is a 72 y.o. female who presents to the clinic on 10/14/2017 with a chief complaint of Allergies; Sinus Problem (runny nose, nose bleeds, PND (bloody)); and Asthma (cough, wheeze) .  HPI: Lorraine Buckley presents to this clinic in evaluation of respiratory tract problems.  She has a long history of asthma dating back many years presently well controlled on the use of a combination inhaler with a 0 requirement for a short acting bronchodilator and without the need for systemic steroid to treat an exacerbation in greater than 1 year.  She can exert herself without any problem.  There is not really an obvious provoking factor giving rise to this issue.  She also has a long history of nasal congestion and sneezing occurring on a perennial basis with spring and fall exacerbation.  She does not have any associated anosmia or inability to taste or history of ugly nasal discharge or headaches but she has developed problems with epistaxis whenever she uses nasal steroids.  She has required 2 emergency room evaluations for epistaxis with her most recent episode occurring about 6 weeks ago requiring nasal packing.  She subsequently saw Dr. Lucia Gaskins and Dr. Ernesto Rutherford for a continued bleed and apparently there may have been a polyp identified in her upper airway that was bleeding.  It is predominately the left side that bleeds.  It should be noted that on both of her episodes for epistaxis requiring emergency room evaluation she was using Flonase.  If she does not use Flonase she definitely develops problems with significant nasal congestion and sneezing.  Past Medical History:  Diagnosis Date  . Arthritis    FINGERS AND NECK HURT  . Asthma    ALLERGY INDUCED ASTHMA  . Gallstones    ABD PAIN, BAD GAS. FREQ STOOLS   . Heart murmur    PT NOT SURE IF MURMUR STILL HEARD - DOES NOT CAUSE ANY SYMPTOMS  . Hypertension   . Hypothyroidism   . Pneumonia    ABOUT FEB 2013  . PONV (postoperative nausea and vomiting)   . Shortness of breath dyspnea    OCCAS SOB  . Thyroid disease     Past Surgical History:  Procedure Laterality Date  . ABDOMINAL HYSTERECTOMY    . BUNIONECTOMY     RIGHT BIG TOE  . CHOLECYSTECTOMY N/A 03/25/2014   Procedure: LAPAROSCOPIC CHOLECYSTECTOMY WITH INTRAOPERATIVE CHOLANGIOGRAM;  Surgeon: Armandina Gemma, MD;  Location: [facility];  Service: General;  Laterality: N/A;  . EYE SURGERY     LASIK EYE SURGERY  . TUBAL LIGATION      Allergies as of 10/14/2017      Reactions   Penicillins Other (See Comments)   unknown      Medication List      albuterol 108 (90 Base) MCG/ACT inhaler Commonly known as:  PROVENTIL HFA;VENTOLIN HFA Inhale 2 puffs into the lungs every 4 (four) hours as needed. For breathing   amLODipine 2.5 MG tablet Commonly known as:  NORVASC Take 2.5 mg by mouth every morning.   BREO ELLIPTA 200-25 MCG/INH Aepb Generic drug:  fluticasone furoate-vilanterol Inhale 1 puff into the lungs daily.   cholecalciferol 1000 units tablet Commonly known as:  VITAMIN D Take 1,000 Units by mouth daily.   fexofenadine 180 MG tablet Commonly known as:  ALLEGRA Take 180 mg by mouth daily.   levothyroxine 100 MCG tablet Commonly known as:  SYNTHROID, LEVOTHROID Take 100 mcg by mouth daily before breakfast.   RESTASIS 0.05 % ophthalmic emulsion Generic drug:  cycloSPORINE Place 1 drop into both eyes daily.       Review of systems negative except as noted in HPI / PMHx or noted below:  Review of Systems  Constitutional: Negative.   HENT: Negative.   Eyes:       Dry eye syndrome treated with Restasis  Respiratory: Negative.   Cardiovascular: Negative.   Gastrointestinal: Negative.   Genitourinary: Negative.   Musculoskeletal: Negative.   Skin: Negative.     Neurological: Negative.   Endo/Heme/Allergies: Negative.   Psychiatric/Behavioral: Negative.     Family History  Problem Relation Age of Onset  . Asthma Mother   . Hypertension Mother   . Irritable bowel syndrome Mother   . Irritable bowel syndrome Sister   . Irritable bowel syndrome Brother   . Heart disease Maternal Grandfather   . Emphysema Maternal Grandfather     Social History   Socioeconomic History  . Marital status: Married    Spouse name: Not on file  . Number of children: Not on file  . Years of education: Not on file  . Highest education level: Not on file  Occupational History  . Not on file  Social Needs  . Financial resource strain: Not on file  . Food insecurity:    Worry: Not on file    Inability: Not on file  . Transportation needs:    Medical: Not on file    Non-medical: Not on file  Tobacco Use  . Smoking status: Never Smoker  . Smokeless tobacco: Never Used  Substance and Sexual Activity  . Alcohol use: No  . Drug use: No  . Sexual activity: Not Currently  Lifestyle  . Physical activity:    Days per week: Not on file    Minutes per session: Not on file  . Stress: Not on file  Relationships  . Social connections:    Talks on phone: Not on file    Gets together: Not on file    Attends religious service: Not on file    Active member of club or organization: Not on file    Attends meetings of clubs or organizations: Not on file    Relationship status: Not on file  . Intimate partner violence:    Fear of current or ex partner: Not on file    Emotionally abused: Not on file    Physically abused: Not on file    Forced sexual activity: Not on file  Other Topics Concern  . Not on file  Social History Narrative  . Not on file    Environmental and Social history  Lives in a house with a dry environment, no animals located inside the household, would in the bedroom floor, plastic on the bed, plastic on the pillow, no smokers located inside  the household.  Objective:  There were no vitals filed for this visit. Height: 5' 6.69" (169.4 cm) Weight: 232 lb 3.2 oz (105.3 kg)  Physical Exam  HENT:  Head: Normocephalic. Head is without right periorbital erythema and without left periorbital erythema.  Right Ear: Tympanic membrane, external ear and ear canal normal.  Left Ear: Tympanic membrane, external ear and ear canal normal.  Nose: Nose normal. No mucosal edema or rhinorrhea.  Mouth/Throat: Oropharynx is clear and moist and mucous membranes  are normal. No oropharyngeal exudate.  Eyes: Pupils are equal, round, and reactive to light. Conjunctivae and lids are normal.  Neck: Trachea normal. No tracheal deviation present. No thyromegaly present.  Cardiovascular: Normal rate, regular rhythm, S1 normal, S2 normal and normal heart sounds.  No murmur heard. Pulmonary/Chest: Effort normal. No stridor. No respiratory distress. She has no wheezes. She has no rales. She exhibits no tenderness.  Abdominal: Soft. She exhibits no distension and no mass. There is no hepatosplenomegaly. There is no tenderness. There is no rebound and no guarding.  Musculoskeletal: She exhibits no edema or tenderness.  Lymphadenopathy:       Head (right side): No tonsillar adenopathy present.       Head (left side): No tonsillar adenopathy present.    She has no cervical adenopathy.    She has no axillary adenopathy.  Neurological: She is alert.  Skin: No rash noted. She is not diaphoretic. No erythema. No pallor. Nails show no clubbing.    Diagnostics: Allergy skin tests were performed.  She demonstrated hypersensitivity against house dust mite and dog and mold.  Spirometry was performed and demonstrated an FEV1 of 2.37 @ 93 % of predicted. FEV1/FVC = 0.78  Assessment and Plan:    1. Asthma, moderate persistent, well-controlled   2. Other allergic rhinitis   3. Dry eye syndrome of both eyes   4. Epistaxis     1.  Allergen avoidance measures  2.   Attempt to eliminate use of oral antihistamines because of dry eye syndrome  3.  Treat and prevent inflammation:   A.  Montelukast 10 mg tablet 1 time per day  B.  OTC Rhinocort 1 spray each nostril 1 time per day. Left nostril use?  C.  Continue Breo 200 1 inhalation 1 time per day  4.  If needed:   A.  Azelastine -1-2 sprays each nostril 1-2 times per day  B.  Nasal saline  C.  Patanol 1 drop each eye twice a day  5.  Return to clinic in 4 weeks or earlier if problem  Lorraine Buckley appears to have atopic disease giving rise to problems with her airway and she should respond adequately to a combination of allergen avoidance measures and the consistent use of anti-inflammatory agents for her respiratory tract as noted above.  I have encouraged her to attempt to get her atopic disease under good control without the use of oral antihistamines given her dry eye syndrome.  As well, I have asked her to minimize use of nasal steroids to her left nasal airway given her previous problem with epistaxis when using these agents.  We will see how things go over the course of the next 4 weeks.  Allena Katz, MD Allergy / Immunology [place]

## 2017-10-14 NOTE — Patient Instructions (Addendum)
  1.  Allergen avoidance measures  2.  Attempt to eliminate use of oral antihistamines because of dry eye syndrome  3.  Treat and prevent inflammation:   A.  Montelukast 10 mg tablet 1 time per day  B.  OTC Rhinocort 1 spray each nostril 1 time per day. Left nostril use?  C.  Continue Breo 200 1 inhalation 1 time per day  4.  If needed:   A. Azelastine -1-2 sprays each nostril 1-2 times per day  B.  Nasal saline  C.  Patanol 1 drop each eye twice a day  5.  Return to clinic in 4 weeks or earlier if problem

## 2017-10-15 ENCOUNTER — Encounter: Payer: Self-pay | Admitting: Allergy and Immunology

## 2017-10-16 DIAGNOSIS — R040 Epistaxis: Secondary | ICD-10-CM | POA: Diagnosis not present

## 2017-10-17 DIAGNOSIS — R040 Epistaxis: Secondary | ICD-10-CM | POA: Diagnosis not present

## 2017-11-11 ENCOUNTER — Ambulatory Visit: Payer: Medicare Other | Admitting: Allergy and Immunology

## 2017-11-11 ENCOUNTER — Encounter: Payer: Self-pay | Admitting: Allergy and Immunology

## 2017-11-11 ENCOUNTER — Ambulatory Visit (INDEPENDENT_AMBULATORY_CARE_PROVIDER_SITE_OTHER): Payer: Medicare Other | Admitting: Allergy and Immunology

## 2017-11-11 VITALS — BP 140/82 | HR 72 | Resp 18

## 2017-11-11 DIAGNOSIS — J4540 Moderate persistent asthma, uncomplicated: Secondary | ICD-10-CM

## 2017-11-11 DIAGNOSIS — J3089 Other allergic rhinitis: Secondary | ICD-10-CM

## 2017-11-11 NOTE — Patient Instructions (Addendum)
  1.  Continue to perform Allergen avoidance measures  2.  Continue to Treat and prevent inflammation:   A. Continue Breo 200 1 inhalation 1 time per day  3.  If needed:   A.  Claritin 10mg  tablet 1 time per day  B.  Nasal saline  C.  Patanol 1 drop each eye twice a day  4. Obtain fall flu vaccine  5.  Return to clinic in 6 months or earlier if problem

## 2017-11-11 NOTE — Progress Notes (Signed)
Follow-up Note  Referring Provider: Hulan Fess, MD Primary Provider: Hulan Fess, MD Date of Office Visit: 11/11/2017  Subjective:   Lorraine Buckley (DOB: [birth year]) is a 72 y.o. female who returns to the [facility] [place] on 11/11/2017 in re-evaluation of the following:  HPI: Zakyria presents to this clinic in evaluation of asthma and allergic rhinoconjunctivitis.  I last saw her in his clinic 14 Oct 2017 at which point in time she appeared to be having some issues mostly with her upper airways and her asthma appeared to be under good control.  As well she was having an issue with epistaxis.  Her epistaxis required 2 different cauteries since she has been seen in this clinic and she is no longer having any bleeding.  She had apparent palpitations with the use of montelukast and she cannot tolerate using nose sprays so she is relying on the use of Claritin and Breo to treat her atopic respiratory disease in conjunction with allergen avoidance measures as she has performed measures directed against house dust mite.  She feels as though she is doing very well at this point time with very little issue involving either her upper or lower airway.  Even with the use of Claritin she has not been having any problems with her dry eye issue.  She does use a short acting bronchodilator prior to exercise but otherwise does not use this agent in a rescue mode.  Allergies as of 11/11/2017      Reactions   Penicillins Other (See Comments)   unknown      Medication List      albuterol 108 (90 Base) MCG/ACT inhaler Commonly known as:  PROVENTIL HFA;VENTOLIN HFA Inhale 2 puffs into the lungs every 4 (four) hours as needed. For breathing   amLODipine 2.5 MG tablet Commonly known as:  NORVASC Take 2.5 mg by mouth every morning.   azelastine 0.1 % nasal spray Commonly known as:  ASTELIN Use 1-2 sprays per nostril 1-2 times daily   BREO ELLIPTA 200-25 MCG/INH Aepb Generic drug:   fluticasone furoate-vilanterol Inhale 1 puff into the lungs daily.   cholecalciferol 1000 units tablet Commonly known as:  VITAMIN D Take 1,000 Units by mouth daily.   fexofenadine 180 MG tablet Commonly known as:  ALLEGRA Take 180 mg by mouth daily.   levothyroxine 100 MCG tablet Commonly known as:  SYNTHROID, LEVOTHROID Take 100 mcg by mouth daily before breakfast.   PAZEO 0.7 % Soln Generic drug:  Olopatadine HCl Please specify directions, refills and quantity   RESTASIS 0.05 % ophthalmic emulsion Generic drug:  cycloSPORINE Place 1 drop into both eyes daily.       Past Medical History:  Diagnosis Date  . Arthritis    FINGERS AND NECK HURT  . Asthma    ALLERGY INDUCED ASTHMA  . Gallstones    ABD PAIN, BAD GAS. FREQ STOOLS  . Heart murmur    PT NOT SURE IF MURMUR STILL HEARD - DOES NOT CAUSE ANY SYMPTOMS  . Hypertension   . Hypothyroidism   . Pneumonia    ABOUT FEB 2013  . PONV (postoperative nausea and vomiting)   . Shortness of breath dyspnea    OCCAS SOB  . Thyroid disease     Past Surgical History:  Procedure Laterality Date  . ABDOMINAL HYSTERECTOMY    . BUNIONECTOMY     RIGHT BIG TOE  . CHOLECYSTECTOMY N/A 03/25/2014   Procedure: LAPAROSCOPIC CHOLECYSTECTOMY WITH INTRAOPERATIVE CHOLANGIOGRAM;  Surgeon: Armandina Gemma, MD;  Location: [facility];  Service: General;  Laterality: N/A;  . EYE SURGERY     LASIK EYE SURGERY  . TUBAL LIGATION      Review of systems negative except as noted in HPI / PMHx or noted below:  Review of Systems  Constitutional: Negative.   HENT: Negative.   Eyes: Negative.   Respiratory: Negative.   Cardiovascular: Negative.   Gastrointestinal: Negative.   Genitourinary: Negative.   Musculoskeletal: Negative.   Skin: Negative.   Neurological: Negative.   Endo/Heme/Allergies: Negative.   Psychiatric/Behavioral: Negative.      Objective:   Vitals:   11/11/17 1625  BP: 140/82  Pulse: 72  Resp: 18  SpO2: 98%            Physical Exam  HENT:  Head: Normocephalic.  Right Ear: Tympanic membrane, external ear and ear canal normal.  Left Ear: Tympanic membrane, external ear and ear canal normal.  Nose: Nose normal. No mucosal edema or rhinorrhea.  Mouth/Throat: Uvula is midline, oropharynx is clear and moist and mucous membranes are normal. No oropharyngeal exudate.  Eyes: Conjunctivae are normal.  Neck: Trachea normal. No tracheal tenderness present. No tracheal deviation present. No thyromegaly present.  Cardiovascular: Normal rate, regular rhythm, S1 normal, S2 normal and normal heart sounds.  No murmur heard. Pulmonary/Chest: Breath sounds normal. No stridor. No respiratory distress. She has no wheezes. She has no rales.  Musculoskeletal: She exhibits no edema.  Lymphadenopathy:       Head (right side): No tonsillar adenopathy present.       Head (left side): No tonsillar adenopathy present.    She has no cervical adenopathy.  Neurological: She is alert.  Skin: No rash noted. She is not diaphoretic. No erythema. Nails show no clubbing.    Diagnostics:    Spirometry was performed and demonstrated an FEV1 of 2.52 at 98 % of predicted.  The patient had an Asthma Control Test with the following results: ACT Total Score: 23.    Assessment and Plan:   1. Asthma, moderate persistent, well-controlled   2. Other allergic rhinitis     1.  Continue to perform Allergen avoidance measures  2.  Continue to Treat and prevent inflammation:   A. Continue Breo 200 1 inhalation 1 time per day  3.  If needed:   A.  Claritin 10mg  tablet 1 time per day  B.  Nasal saline  C.  Patanol 1 drop each eye twice a day  4. Obtain fall flu vaccine  5.  Return to clinic in 6 months or earlier if problem  Shamyah appears to be doing very well on her current plan and she will continue to utilize allergen avoidance measures and the use of anti-inflammatory agents for her airway and other medications if needed and I  will see her back in this clinic in 6 months or earlier if there is a problem.  Allena Katz, MD Allergy / Immunology [place]

## 2017-11-12 ENCOUNTER — Encounter: Payer: Self-pay | Admitting: Allergy and Immunology

## 2018-02-13 DIAGNOSIS — Z23 Encounter for immunization: Secondary | ICD-10-CM | POA: Diagnosis not present

## 2018-03-31 ENCOUNTER — Other Ambulatory Visit: Payer: Self-pay | Admitting: Family Medicine

## 2018-03-31 DIAGNOSIS — Z1231 Encounter for screening mammogram for malignant neoplasm of breast: Secondary | ICD-10-CM

## 2018-04-21 DIAGNOSIS — R42 Dizziness and giddiness: Secondary | ICD-10-CM | POA: Diagnosis not present

## 2018-06-03 DIAGNOSIS — R002 Palpitations: Secondary | ICD-10-CM | POA: Diagnosis not present

## 2018-06-03 DIAGNOSIS — E042 Nontoxic multinodular goiter: Secondary | ICD-10-CM | POA: Diagnosis not present

## 2018-06-03 DIAGNOSIS — R7301 Impaired fasting glucose: Secondary | ICD-10-CM | POA: Diagnosis not present

## 2018-06-03 DIAGNOSIS — Z79899 Other long term (current) drug therapy: Secondary | ICD-10-CM | POA: Diagnosis not present

## 2018-06-03 DIAGNOSIS — Z6836 Body mass index (BMI) 36.0-36.9, adult: Secondary | ICD-10-CM | POA: Diagnosis not present

## 2018-06-03 DIAGNOSIS — Z0000 Encounter for general adult medical examination without abnormal findings: Secondary | ICD-10-CM | POA: Diagnosis not present

## 2018-06-03 DIAGNOSIS — R8290 Unspecified abnormal findings in urine: Secondary | ICD-10-CM | POA: Diagnosis not present

## 2018-06-03 DIAGNOSIS — J4530 Mild persistent asthma, uncomplicated: Secondary | ICD-10-CM | POA: Diagnosis not present

## 2018-06-03 DIAGNOSIS — E785 Hyperlipidemia, unspecified: Secondary | ICD-10-CM | POA: Diagnosis not present

## 2018-06-03 DIAGNOSIS — J309 Allergic rhinitis, unspecified: Secondary | ICD-10-CM | POA: Diagnosis not present

## 2018-06-04 ENCOUNTER — Ambulatory Visit
Admission: RE | Admit: 2018-06-04 | Discharge: 2018-06-04 | Disposition: A | Discharge status: Home / Self Care | Payer: Medicare Other | Source: Ambulatory Visit | Attending: Family Medicine | Admitting: Family Medicine

## 2018-06-04 ENCOUNTER — Other Ambulatory Visit: Payer: Self-pay | Admitting: Family Medicine

## 2018-06-04 DIAGNOSIS — Z1231 Encounter for screening mammogram for malignant neoplasm of breast: Secondary | ICD-10-CM | POA: Diagnosis not present

## 2018-06-04 DIAGNOSIS — E042 Nontoxic multinodular goiter: Secondary | ICD-10-CM

## 2018-06-09 ENCOUNTER — Ambulatory Visit
Admission: RE | Admit: 2018-06-09 | Discharge: 2018-06-09 | Disposition: A | Discharge status: Home / Self Care | Payer: PPO | Source: Ambulatory Visit | Attending: Family Medicine | Admitting: Family Medicine

## 2018-06-09 DIAGNOSIS — E049 Nontoxic goiter, unspecified: Secondary | ICD-10-CM | POA: Diagnosis not present

## 2018-06-09 DIAGNOSIS — E042 Nontoxic multinodular goiter: Secondary | ICD-10-CM

## 2018-09-02 DIAGNOSIS — R040 Epistaxis: Secondary | ICD-10-CM | POA: Diagnosis not present

## 2018-09-02 DIAGNOSIS — Z23 Encounter for immunization: Secondary | ICD-10-CM | POA: Diagnosis not present

## 2018-09-25 ENCOUNTER — Other Ambulatory Visit: Payer: Self-pay | Admitting: Surgery

## 2018-09-25 DIAGNOSIS — E042 Nontoxic multinodular goiter: Secondary | ICD-10-CM

## 2018-09-30 DIAGNOSIS — L821 Other seborrheic keratosis: Secondary | ICD-10-CM | POA: Diagnosis not present

## 2018-09-30 DIAGNOSIS — L304 Erythema intertrigo: Secondary | ICD-10-CM | POA: Diagnosis not present

## 2018-09-30 DIAGNOSIS — D239 Other benign neoplasm of skin, unspecified: Secondary | ICD-10-CM | POA: Diagnosis not present

## 2018-09-30 DIAGNOSIS — L814 Other melanin hyperpigmentation: Secondary | ICD-10-CM | POA: Diagnosis not present

## 2018-09-30 DIAGNOSIS — D225 Melanocytic nevi of trunk: Secondary | ICD-10-CM | POA: Diagnosis not present

## 2018-09-30 DIAGNOSIS — L820 Inflamed seborrheic keratosis: Secondary | ICD-10-CM | POA: Diagnosis not present

## 2018-09-30 DIAGNOSIS — D692 Other nonthrombocytopenic purpura: Secondary | ICD-10-CM | POA: Diagnosis not present

## 2018-09-30 DIAGNOSIS — L570 Actinic keratosis: Secondary | ICD-10-CM | POA: Diagnosis not present

## 2019-01-27 DIAGNOSIS — J4530 Mild persistent asthma, uncomplicated: Secondary | ICD-10-CM | POA: Diagnosis not present

## 2019-01-27 DIAGNOSIS — E039 Hypothyroidism, unspecified: Secondary | ICD-10-CM | POA: Diagnosis not present

## 2019-01-27 DIAGNOSIS — E785 Hyperlipidemia, unspecified: Secondary | ICD-10-CM | POA: Diagnosis not present

## 2019-03-03 DIAGNOSIS — I10 Essential (primary) hypertension: Secondary | ICD-10-CM | POA: Diagnosis not present

## 2019-03-03 DIAGNOSIS — E039 Hypothyroidism, unspecified: Secondary | ICD-10-CM | POA: Diagnosis not present

## 2019-03-03 DIAGNOSIS — J4530 Mild persistent asthma, uncomplicated: Secondary | ICD-10-CM | POA: Diagnosis not present

## 2019-03-03 DIAGNOSIS — E785 Hyperlipidemia, unspecified: Secondary | ICD-10-CM | POA: Diagnosis not present

## 2019-03-17 DIAGNOSIS — Z6838 Body mass index (BMI) 38.0-38.9, adult: Secondary | ICD-10-CM | POA: Diagnosis not present

## 2019-03-17 DIAGNOSIS — I10 Essential (primary) hypertension: Secondary | ICD-10-CM | POA: Diagnosis not present

## 2019-03-17 DIAGNOSIS — E785 Hyperlipidemia, unspecified: Secondary | ICD-10-CM | POA: Diagnosis not present

## 2019-03-17 DIAGNOSIS — J4530 Mild persistent asthma, uncomplicated: Secondary | ICD-10-CM | POA: Diagnosis not present

## 2019-03-17 DIAGNOSIS — E042 Nontoxic multinodular goiter: Secondary | ICD-10-CM | POA: Diagnosis not present

## 2019-03-17 DIAGNOSIS — R7301 Impaired fasting glucose: Secondary | ICD-10-CM | POA: Diagnosis not present

## 2019-03-17 DIAGNOSIS — J309 Allergic rhinitis, unspecified: Secondary | ICD-10-CM | POA: Diagnosis not present

## 2019-03-17 DIAGNOSIS — Z111 Encounter for screening for respiratory tuberculosis: Secondary | ICD-10-CM | POA: Diagnosis not present

## 2019-03-25 DIAGNOSIS — H40013 Open angle with borderline findings, low risk, bilateral: Secondary | ICD-10-CM | POA: Diagnosis not present

## 2019-04-02 DIAGNOSIS — E039 Hypothyroidism, unspecified: Secondary | ICD-10-CM | POA: Diagnosis not present

## 2019-04-02 DIAGNOSIS — J4530 Mild persistent asthma, uncomplicated: Secondary | ICD-10-CM | POA: Diagnosis not present

## 2019-04-02 DIAGNOSIS — E785 Hyperlipidemia, unspecified: Secondary | ICD-10-CM | POA: Diagnosis not present

## 2019-04-02 DIAGNOSIS — I10 Essential (primary) hypertension: Secondary | ICD-10-CM | POA: Diagnosis not present

## 2019-06-17 DIAGNOSIS — E785 Hyperlipidemia, unspecified: Secondary | ICD-10-CM | POA: Diagnosis not present

## 2019-06-17 DIAGNOSIS — E039 Hypothyroidism, unspecified: Secondary | ICD-10-CM | POA: Diagnosis not present

## 2019-06-17 DIAGNOSIS — Z1159 Encounter for screening for other viral diseases: Secondary | ICD-10-CM | POA: Diagnosis not present

## 2019-06-17 DIAGNOSIS — I10 Essential (primary) hypertension: Secondary | ICD-10-CM | POA: Diagnosis not present

## 2019-06-17 DIAGNOSIS — Z0000 Encounter for general adult medical examination without abnormal findings: Secondary | ICD-10-CM | POA: Diagnosis not present

## 2019-06-17 DIAGNOSIS — Z1211 Encounter for screening for malignant neoplasm of colon: Secondary | ICD-10-CM | POA: Diagnosis not present

## 2019-06-17 DIAGNOSIS — E042 Nontoxic multinodular goiter: Secondary | ICD-10-CM | POA: Diagnosis not present

## 2019-06-17 DIAGNOSIS — R7301 Impaired fasting glucose: Secondary | ICD-10-CM | POA: Diagnosis not present

## 2019-06-17 DIAGNOSIS — J4530 Mild persistent asthma, uncomplicated: Secondary | ICD-10-CM | POA: Diagnosis not present

## 2019-06-17 DIAGNOSIS — J309 Allergic rhinitis, unspecified: Secondary | ICD-10-CM | POA: Diagnosis not present

## 2019-06-21 ENCOUNTER — Other Ambulatory Visit: Payer: Self-pay | Admitting: Family Medicine

## 2019-06-21 DIAGNOSIS — E042 Nontoxic multinodular goiter: Secondary | ICD-10-CM

## 2019-06-30 DIAGNOSIS — E039 Hypothyroidism, unspecified: Secondary | ICD-10-CM | POA: Diagnosis not present

## 2019-06-30 DIAGNOSIS — I10 Essential (primary) hypertension: Secondary | ICD-10-CM | POA: Diagnosis not present

## 2019-06-30 DIAGNOSIS — E785 Hyperlipidemia, unspecified: Secondary | ICD-10-CM | POA: Diagnosis not present

## 2019-06-30 DIAGNOSIS — J4530 Mild persistent asthma, uncomplicated: Secondary | ICD-10-CM | POA: Diagnosis not present

## 2019-07-26 ENCOUNTER — Ambulatory Visit
Admission: RE | Admit: 2019-07-26 | Discharge: 2019-07-26 | Disposition: A | Discharge status: Home / Self Care | Payer: PPO | Source: Ambulatory Visit | Attending: Family Medicine | Admitting: Family Medicine

## 2019-07-26 DIAGNOSIS — E042 Nontoxic multinodular goiter: Secondary | ICD-10-CM

## 2019-07-26 DIAGNOSIS — E041 Nontoxic single thyroid nodule: Secondary | ICD-10-CM | POA: Diagnosis not present

## 2019-08-04 ENCOUNTER — Other Ambulatory Visit: Payer: Self-pay | Admitting: Family Medicine

## 2019-08-04 ENCOUNTER — Other Ambulatory Visit (HOSPITAL_COMMUNITY): Payer: Self-pay | Admitting: Family Medicine

## 2019-08-04 DIAGNOSIS — R413 Other amnesia: Secondary | ICD-10-CM

## 2019-08-05 ENCOUNTER — Other Ambulatory Visit: Payer: Self-pay | Admitting: Family Medicine

## 2019-08-05 ENCOUNTER — Encounter (HOSPITAL_COMMUNITY): Payer: Self-pay | Admitting: *Deleted

## 2019-08-05 ENCOUNTER — Ambulatory Visit (HOSPITAL_COMMUNITY)
Admission: RE | Admit: 2019-08-05 | Discharge: 2019-08-05 | Disposition: A | Discharge status: Home / Self Care | Payer: PPO | Source: Ambulatory Visit | Attending: Family Medicine | Admitting: Family Medicine

## 2019-08-05 ENCOUNTER — Other Ambulatory Visit: Payer: Self-pay

## 2019-08-05 ENCOUNTER — Other Ambulatory Visit (HOSPITAL_COMMUNITY): Payer: Self-pay | Admitting: Family Medicine

## 2019-08-05 DIAGNOSIS — R413 Other amnesia: Secondary | ICD-10-CM | POA: Insufficient documentation

## 2019-08-05 LAB — CREATININE, SERUM
Creatinine, Ser: 0.89 mg/dL (ref 0.44–1.00)
GFR calc Af Amer: >60 mL/min (ref >60)
GFR calc non Af Amer: >60 mL/min (ref >60)

## 2019-08-05 MED ORDER — GADOBUTROL 1 MMOL/ML IV SOLN
10.0000 mL | Freq: Once | INTRAVENOUS | Status: AC | PRN
  Administered 2019-08-05: 10 mL via INTRAVENOUS

## 2019-08-11 DIAGNOSIS — E039 Hypothyroidism, unspecified: Secondary | ICD-10-CM | POA: Diagnosis not present

## 2019-08-11 DIAGNOSIS — E785 Hyperlipidemia, unspecified: Secondary | ICD-10-CM | POA: Diagnosis not present

## 2019-08-11 DIAGNOSIS — I10 Essential (primary) hypertension: Secondary | ICD-10-CM | POA: Diagnosis not present

## 2019-08-11 DIAGNOSIS — J4530 Mild persistent asthma, uncomplicated: Secondary | ICD-10-CM | POA: Diagnosis not present

## 2019-09-07 ENCOUNTER — Other Ambulatory Visit: Payer: Self-pay

## 2019-09-07 ENCOUNTER — Ambulatory Visit: Payer: PPO | Admitting: Neurology

## 2019-09-07 ENCOUNTER — Encounter: Payer: Self-pay | Admitting: Neurology

## 2019-09-07 VITALS — BP 142/95 | HR 72 | Temp 96.8°F | Ht 67.0 in | Wt 235.0 lb

## 2019-09-07 DIAGNOSIS — E669 Obesity, unspecified: Secondary | ICD-10-CM

## 2019-09-07 DIAGNOSIS — G479 Sleep disorder, unspecified: Secondary | ICD-10-CM | POA: Diagnosis not present

## 2019-09-07 DIAGNOSIS — R351 Nocturia: Secondary | ICD-10-CM

## 2019-09-07 DIAGNOSIS — G454 Transient global amnesia: Secondary | ICD-10-CM | POA: Diagnosis not present

## 2019-09-07 DIAGNOSIS — I499 Cardiac arrhythmia, unspecified: Secondary | ICD-10-CM | POA: Diagnosis not present

## 2019-09-07 DIAGNOSIS — R6889 Other general symptoms and signs: Secondary | ICD-10-CM

## 2019-09-07 DIAGNOSIS — R4189 Other symptoms and signs involving cognitive functions and awareness: Secondary | ICD-10-CM

## 2019-09-07 NOTE — Patient Instructions (Addendum)
Your neurological exam is normal which is very reassuring.  Please call Dr. Eddie Dibbles office to see if you can get an EKG done.  I noticed an irregular heartbeat today.  It is possible that you have intermittent extra beats called PVCs, also a possibility could be irregular heartbeat called atrial fibrillation which would need further work-up and treatment potentially.  Please let me know when you get an EKG done.  Nevertheless, I would like to do some additional work-up to investigate your episode of amnesia and memory impairments/repeating yourself. Reasons for your memory lapse could be several, I doubt that this was a TIA or seizure.  Possibilities include: TGA (transient global amnesia), during which patients can have some loss of time, typically no stroke like presentation and patients typically continue to talk and move, but have no recollection of the period, which can last for minutes or hours. We don't quite understand the etiology or significance of TGA as yet. Some reports indicate a migraine-like etiology, some others mention a seizure-like presentation or even stroke-like origin.  Since you did have a headache, it is possible that you have a migraine.  Sleep deprivation may also be a possibility.  Since you do not sleep very well I would like to exclude obstructive sleep apnea with a sleep study.  In addition, we will proceed with an EEG (brainwave test), which we will schedule. We will call you with the results. We will do a carotid Doppler ultrasound. We will call you with the test results. Likely, they will call to schedule after insurance authorizes the test.

## 2019-09-07 NOTE — Progress Notes (Signed)
Subjective:    Patient ID: Lorraine Buckley is a 74 y.o. female.  HPI     Star Age, MD, PhD [facility] [address], [address] [place], [place] [address]  Dear Dr. Rex Kras,  I saw your patient, Lorraine Buckley, Upon your kind request, neurologic clinic today for initial consultation of her memory loss.  The patient is unaccompanied today.  As you know, [name] is a 74 year old right-handed woman with an underlying medical history of asthma, hyperlipidemia, thyroid disease with history of multinodular goiter, vitamin D deficiency, allergic rhinitis, hypertension, vitamin D deficiency, obesity, and impaired fasting glucose, who reports an episode in mid March of memory lapse/amnesia.  She had an approximately 15-minute episode while on vacation of confusion, asking the same question over and over about 5 times and not able to retain information.  She had no obvious facial droop or slurring of speech or one-sided weakness but did get concerned about having a stroke when she had her memory return.  She has never had an episode like this before or after.  She does report that she had quite a bit of stress and had recently lost her mom in February 2021 at the age of 41.  There is no family history on mother side of dementia.  Her father lived to be 30 and committed suicide, patient reports that he had chronic pain from arthritis and also alcohol dependence.  Parents were divorced.  She recalls that her paternal aunts had some memory issues, her mom had some forgetfulness in the last part of her life.  Patient reports that she had not been sleeping very well.  She has chronic difficulty maintaining sleep and may get an average of 6 hours of sleep.  She snores some but not consistently and not severely as far as she knows. Her 2 brothers have sleep apnea, one may use a CPAP, the other she is not sure.  I reviewed your office note from 08/04/2019.  She had a televisit  with you at the time. She did have a headache at the time and has a history of migraines. You ordered a brain MRI.  She had a brain MRI with and without contrast on 08/05/2019 and I reviewed the results:   IMPRESSION: No evidence of recent infarction, hemorrhage, or mass.   Mild to moderate chronic microvascular ischemic changes.   She has nocturia about 2 or 3 times per average night, denies any morning headaches.  She is a non-smoker and does not drink alcohol and drinks very little caffeine.  Her Past Medical History Is Significant For: Past Medical History:  Diagnosis Date  . Acute memory impairment   . Arthritis    FINGERS AND NECK HURT  . Asthma    ALLERGY INDUCED ASTHMA  . Gallstones    ABD PAIN, BAD GAS. FREQ STOOLS  . Heart murmur    PT NOT SURE IF MURMUR STILL HEARD - DOES NOT CAUSE ANY SYMPTOMS  . Hyperlipidemia   . Hypertension   . Hypothyroidism   . Pneumonia    ABOUT FEB 2013  . PONV (postoperative nausea and vomiting)   . Shortness of breath dyspnea    OCCAS SOB  . Thyroid disease   . Vitamin D deficiency     Her Past Surgical History Is Significant For: Past Surgical History:  Procedure Laterality Date  . ABDOMINAL HYSTERECTOMY    . BUNIONECTOMY     RIGHT BIG TOE  . CHOLECYSTECTOMY N/A  03/25/2014   Procedure: LAPAROSCOPIC CHOLECYSTECTOMY WITH INTRAOPERATIVE CHOLANGIOGRAM;  Surgeon: Armandina Gemma, MD;  Location: [facility];  Service: General;  Laterality: N/A;  . EYE SURGERY     LASIK EYE SURGERY  . TUBAL LIGATION      Her Family History Is Significant For: Family History  Problem Relation Age of Onset  . Asthma Mother   . Hypertension Mother   . Irritable bowel syndrome Mother   . Irritable bowel syndrome Sister   . Irritable bowel syndrome Brother   . Heart disease Maternal Grandfather   . Emphysema Maternal Grandfather   . Breast cancer Maternal Grandmother        in 80's    Her Social History Is Significant For: Social History   Socioeconomic  History  . Marital status: Married    Spouse name: Not on file  . Number of children: Not on file  . Years of education: Not on file  . Highest education level: Not on file  Occupational History  . Not on file  Tobacco Use  . Smoking status: Never Smoker  . Smokeless tobacco: Never Used  Substance and Sexual Activity  . Alcohol use: No  . Drug use: No  . Sexual activity: Not Currently  Other Topics Concern  . Not on file  Social History Narrative  . Not on file   Social Determinants of Health   Financial Resource Strain:   . Difficulty of Paying Living Expenses:   Food Insecurity:   . Worried About Charity fundraiser in the Last Year:   . Arboriculturist in the Last Year:   Transportation Needs:   . Film/video editor (Medical):   [name] Lack of Transportation (Non-Medical):   Physical Activity:   . Days of Exercise per Week:   . Minutes of Exercise per Session:   Stress:   . Feeling of Stress :   Social Connections:   . Frequency of Communication with Friends and Family:   . Frequency of Social Gatherings with Friends and Family:   . Attends Religious Services:   . Active Member of Clubs or Organizations:   . Attends Archivist Meetings:   [name] Marital Status:     Her Allergies Are:  Allergies  Allergen Reactions  . Penicillins Other (See Comments)    unknown  :   Her Current Medications Are:  Outpatient Encounter Medications as of 09/07/2019  Medication Sig  . albuterol (PROVENTIL HFA;VENTOLIN HFA) 108 (90 BASE) MCG/ACT inhaler Inhale 2 puffs into the lungs every 4 (four) hours as needed. For breathing  . amLODipine (NORVASC) 2.5 MG tablet Take 2.5 mg by mouth every morning.   . ASPIRIN 81 PO Take by mouth.  . cholecalciferol (VITAMIN D) 1000 UNITS tablet Take 1,000 Units by mouth daily.  . fexofenadine (ALLEGRA) 180 MG tablet Take 180 mg by mouth daily.  . fluticasone furoate-vilanterol (BREO ELLIPTA) 200-25 MCG/INH AEPB Inhale 1 puff into the lungs  daily.  [name] levothyroxine (SYNTHROID, LEVOTHROID) 100 MCG tablet Take 100 mcg by mouth daily before breakfast.   . Multiple Vitamin (MULTIVITAMIN PO) Take by mouth.  . RESTASIS 0.05 % ophthalmic emulsion Place 1 drop into both eyes daily.   . [DISCONTINUED] azelastine (ASTELIN) 0.1 % nasal spray Use 1-2 sprays per nostril 1-2 times daily (Patient not taking: Reported on 09/07/2019)  . [DISCONTINUED] PAZEO 0.7 % SOLN Please specify directions, refills and quantity   No facility-administered encounter medications on file as of 09/07/2019.  :  Review of Systems:  Out of a complete 14 point review of systems, all are reviewed and negative with the exception of these symptoms as listed below:     Review of Systems  Neurological:       Pt sts in March she was visiting family and she had a moment for about 5 to 10 mins where she was confused. She could not remember washing her hair and what he lunch plans were. After this episode she had a h/a and took a tylenol to help She was worried she may have had a stroke. PCP did complete MRI and was stated as normal.    Objective:  Neurological Exam  Physical Exam Physical Examination:   Vitals:   09/07/19 1000  BP: (!) 142/95  Pulse: 72  Temp: (!) 96.8 F (36 C)    General Examination: The patient is a very pleasant 74 y.o. female in no acute distress. She appears well-developed and well-nourished and well groomed.   HEENT: Normocephalic, atraumatic, pupils are equal, round and reactive to light and accommodation. Funduscopic exam is normal with sharp disc margins noted. Extraocular tracking is good without limitation to gaze excursion or nystagmus noted. Normal smooth pursuit is noted. Hearing is grossly intact. Tympanic membranes are clear bilaterally. Face is symmetric with normal facial animation and normal facial sensation. Speech is clear with no dysarthria noted. There is no hypophonia. There is no lip, neck/head, jaw or voice tremor. Neck  is supple with full range of passive and active motion. There are no carotid bruits on auscultation. Oropharynx exam reveals: mild mouth dryness, adequate dental hygiene and moderate airway crowding, due to small airway entry, tonsillar size is small, Mallampati class III.  Tongue protrudes centrally in palate elevates symmetrically.  Chest: Clear to auscultation without wheezing, rhonchi or crackles noted.  Heart: S1+S2+0, irregular, ?frequent PVCs, vs. Irreg. Irregular, no murmurs.   Abdomen: Soft, non-tender and non-distended with normal bowel sounds appreciated on auscultation.  Extremities: There is no pitting edema in the distal lower extremities bilaterally. Pedal pulses are intact.  Skin: Warm and dry without trophic changes noted.  Musculoskeletal: exam reveals no obvious joint deformities, tenderness or joint swelling or erythema.   Neurologically:  Mental status: The patient is awake, alert and oriented in all 4 spheres. Her immediate and remote memory, attention, language skills and fund of knowledge are appropriate. There is no evidence of aphasia, agnosia, apraxia or anomia. Speech is clear with normal prosody and enunciation. Thought process is linear. Mood is normal and affect is normal.  Cranial nerves II - XII are as described above under HEENT exam. In addition: shoulder shrug is normal with equal shoulder height noted. Motor exam: Normal bulk, strength and tone is noted. There is no drift, tremor or rebound. Romberg is negative. Reflexes are 2+ throughout. Babinski: Toes are flexor bilaterally. Fine motor skills and coordination: intact with normal finger taps, normal hand movements, normal rapid alternating patting, normal foot taps and normal foot agility.  Cerebellar testing: No dysmetria or intention tremor on finger to nose testing. Heel to shin is unremarkable bilaterally. There is no truncal or gait ataxia.  Sensory exam: intact to light touch, vibration, temperature  sense in the upper and lower extremities.  Gait, station and balance: She stands easily. No veering to one side is noted. No leaning to one side is noted. Posture is age-appropriate and stance is narrow based. Gait shows normal stride length and normal pace. No problems turning are noted. Tandem  walk is unremarkable.           Assessment and Plan:   In summary, JOVONA NEAL is a very pleasant 74 y.o.-year old female with an underlying medical history of asthma, hyperlipidemia, thyroid disease with history of multinodular goiter, vitamin D deficiency, allergic rhinitis, hypertension, vitamin D deficiency, obesity, and impaired fasting glucose, who presents for evaluation of a transient lapse in memory/amnesia, lasting for about 15 minutes without any other neurological accompaniments such as stereotypical movements, one-sided weakness, slurring of speech, droopy face.  She did have a headache and differential diagnosis would include migraine headache, transient global amnesia, less likely seizure and less likely TIA.  She had a recent benign brain MRI.  She had some vascular changes.  She has been told to start taking a baby aspirin by you.  She has been taking an 81 mg aspirin daily.  She has had sleep difficulties and sleep deprivation with clouding of cognition may also be in the differential diagnosis.  I would like to proceed with further testing in the form of EEG, carotid Doppler ultrasound and sleep study testing to rule out underlying obstructive sleep apnea.  She is willing to proceed.  I noted irregular heartbeat today.  It did not completely clear to me if it was irregularly irregular.  She was advised to call your office to get evaluated further and get an EKG done.  We talked about benign PVCs versus atrial fibrillation. I plan to see her back in follow-up after testing and we will also keep her posted as to her test results in the interim by phone call updates.  She is advised to stay well  rested, well hydrated with water and we talked about the importance of stress reduction.  I answered all her questions today and the patient was in agreement with the plan. Thank you very much for allowing me to participate in the care of this nice patient. If I can be of any further assistance to you please do not hesitate to call me at [phone].  Sincerely,   Star Age, MD, PhD

## 2019-09-16 DIAGNOSIS — Z6838 Body mass index (BMI) 38.0-38.9, adult: Secondary | ICD-10-CM | POA: Diagnosis not present

## 2019-09-16 DIAGNOSIS — I499 Cardiac arrhythmia, unspecified: Secondary | ICD-10-CM | POA: Diagnosis not present

## 2019-09-16 DIAGNOSIS — I10 Essential (primary) hypertension: Secondary | ICD-10-CM | POA: Diagnosis not present

## 2019-09-27 DIAGNOSIS — E785 Hyperlipidemia, unspecified: Secondary | ICD-10-CM | POA: Diagnosis not present

## 2019-09-27 DIAGNOSIS — E039 Hypothyroidism, unspecified: Secondary | ICD-10-CM | POA: Diagnosis not present

## 2019-09-27 DIAGNOSIS — I10 Essential (primary) hypertension: Secondary | ICD-10-CM | POA: Diagnosis not present

## 2019-09-27 DIAGNOSIS — J4530 Mild persistent asthma, uncomplicated: Secondary | ICD-10-CM | POA: Diagnosis not present

## 2019-10-03 ENCOUNTER — Ambulatory Visit (INDEPENDENT_AMBULATORY_CARE_PROVIDER_SITE_OTHER): Payer: PPO | Admitting: Neurology

## 2019-10-03 DIAGNOSIS — R4189 Other symptoms and signs involving cognitive functions and awareness: Secondary | ICD-10-CM

## 2019-10-03 DIAGNOSIS — G4720 Circadian rhythm sleep disorder, unspecified type: Secondary | ICD-10-CM

## 2019-10-03 DIAGNOSIS — R6889 Other general symptoms and signs: Secondary | ICD-10-CM

## 2019-10-03 DIAGNOSIS — I499 Cardiac arrhythmia, unspecified: Secondary | ICD-10-CM

## 2019-10-03 DIAGNOSIS — G4733 Obstructive sleep apnea (adult) (pediatric): Secondary | ICD-10-CM

## 2019-10-03 DIAGNOSIS — R9431 Abnormal electrocardiogram [ECG] [EKG]: Secondary | ICD-10-CM

## 2019-10-03 DIAGNOSIS — G454 Transient global amnesia: Secondary | ICD-10-CM

## 2019-10-03 DIAGNOSIS — G479 Sleep disorder, unspecified: Secondary | ICD-10-CM

## 2019-10-03 DIAGNOSIS — G478 Other sleep disorders: Secondary | ICD-10-CM

## 2019-10-03 DIAGNOSIS — E669 Obesity, unspecified: Secondary | ICD-10-CM

## 2019-10-03 DIAGNOSIS — R351 Nocturia: Secondary | ICD-10-CM

## 2019-10-05 DIAGNOSIS — D485 Neoplasm of uncertain behavior of skin: Secondary | ICD-10-CM | POA: Diagnosis not present

## 2019-10-05 DIAGNOSIS — L821 Other seborrheic keratosis: Secondary | ICD-10-CM | POA: Diagnosis not present

## 2019-10-05 DIAGNOSIS — L570 Actinic keratosis: Secondary | ICD-10-CM | POA: Diagnosis not present

## 2019-10-05 DIAGNOSIS — L820 Inflamed seborrheic keratosis: Secondary | ICD-10-CM | POA: Diagnosis not present

## 2019-10-05 DIAGNOSIS — L578 Other skin changes due to chronic exposure to nonionizing radiation: Secondary | ICD-10-CM | POA: Diagnosis not present

## 2019-10-05 DIAGNOSIS — D239 Other benign neoplasm of skin, unspecified: Secondary | ICD-10-CM | POA: Diagnosis not present

## 2019-10-05 DIAGNOSIS — L814 Other melanin hyperpigmentation: Secondary | ICD-10-CM | POA: Diagnosis not present

## 2019-10-05 DIAGNOSIS — D692 Other nonthrombocytopenic purpura: Secondary | ICD-10-CM | POA: Diagnosis not present

## 2019-10-05 DIAGNOSIS — D225 Melanocytic nevi of trunk: Secondary | ICD-10-CM | POA: Diagnosis not present

## 2019-10-06 ENCOUNTER — Ambulatory Visit: Payer: PPO

## 2019-10-06 ENCOUNTER — Other Ambulatory Visit: Payer: Self-pay

## 2019-10-06 DIAGNOSIS — I499 Cardiac arrhythmia, unspecified: Secondary | ICD-10-CM

## 2019-10-06 DIAGNOSIS — R410 Disorientation, unspecified: Secondary | ICD-10-CM | POA: Diagnosis not present

## 2019-10-06 DIAGNOSIS — R6889 Other general symptoms and signs: Secondary | ICD-10-CM

## 2019-10-06 DIAGNOSIS — R351 Nocturia: Secondary | ICD-10-CM

## 2019-10-06 DIAGNOSIS — E669 Obesity, unspecified: Secondary | ICD-10-CM

## 2019-10-06 DIAGNOSIS — R4189 Other symptoms and signs involving cognitive functions and awareness: Secondary | ICD-10-CM

## 2019-10-06 DIAGNOSIS — G454 Transient global amnesia: Secondary | ICD-10-CM

## 2019-10-06 DIAGNOSIS — G479 Sleep disorder, unspecified: Secondary | ICD-10-CM

## 2019-10-08 ENCOUNTER — Other Ambulatory Visit: Payer: Self-pay

## 2019-10-08 ENCOUNTER — Ambulatory Visit: Payer: PPO | Admitting: Cardiology

## 2019-10-08 ENCOUNTER — Encounter: Payer: Self-pay | Admitting: Cardiology

## 2019-10-08 VITALS — BP 140/84 | HR 82 | Ht 67.0 in | Wt 236.0 lb

## 2019-10-08 DIAGNOSIS — R404 Transient alteration of awareness: Secondary | ICD-10-CM | POA: Diagnosis not present

## 2019-10-08 DIAGNOSIS — R4182 Altered mental status, unspecified: Secondary | ICD-10-CM

## 2019-10-08 DIAGNOSIS — I4949 Other premature depolarization: Secondary | ICD-10-CM | POA: Diagnosis not present

## 2019-10-08 DIAGNOSIS — R0789 Other chest pain: Secondary | ICD-10-CM

## 2019-10-08 DIAGNOSIS — E785 Hyperlipidemia, unspecified: Secondary | ICD-10-CM | POA: Insufficient documentation

## 2019-10-08 HISTORY — DX: Other chest pain: R07.89

## 2019-10-08 HISTORY — DX: Altered mental status, unspecified: R41.82

## 2019-10-08 HISTORY — DX: Hyperlipidemia, unspecified: E78.5

## 2019-10-08 MED ORDER — METOPROLOL TARTRATE 100 MG PO TABS
100.0000 mg | ORAL_TABLET | Freq: Once | ORAL | 0 refills | Status: DC
Start: 2019-10-08 — End: 2020-01-17

## 2019-10-08 NOTE — Patient Instructions (Signed)
Medication Instructions:  Your physician recommends that you continue on your current medications as directed. Please refer to the Current Medication list given to you today.  *If you need a refill on your cardiac medications before your next appointment, please call your pharmacy*   Lab Work: None.  If you have labs (blood work) drawn today and your tests are completely normal, you will receive your results only by: . MyChart Message (if you have MyChart) OR . A paper copy in the mail If you have any lab test that is abnormal or we need to change your treatment, we will call you to review the results.   Testing/Procedures: Your physician has requested that you have an echocardiogram. Echocardiography is a painless test that uses sound waves to create images of your heart. It provides your doctor with information about the size and shape of your heart and how well your heart's chambers and valves are working. This procedure takes approximately one hour. There are no restrictions for this procedure.  A zio monitor was ordered today. It will remain on for 7 days. You will then return monitor and event diary in provided box. It takes 1-2 weeks for report to be downloaded and returned to us. We will call you with the results. If monitor falls off or has orange flashing light, please call Zio for further instructions.   Your cardiac CT will be scheduled at one of the below locations:   [facility] [address] [place], [place] [address] [phone]  OR  [facility] [address] [place], [place] [address] [phone]  If scheduled at [facility], please arrive at the North Tower main entrance of [facility] 30 minutes prior to test start time. Proceed to the [facility] (first floor) to check-in and test prep.  If scheduled at [facility], please arrive 15 mins  early for check-in and test prep.  Please follow these instructions carefully (unless otherwise directed):    On the Night Before the Test: . Be sure to Drink plenty of water. . Do not consume any caffeinated/decaffeinated beverages or chocolate 12 hours prior to your test. . Do not take any antihistamines 12 hours prior to your test.   On the Day of the Test: . Drink plenty of water. Do not drink any water within one hour of the test. . Do not eat any food 4 hours prior to the test. . You may take your regular medications prior to the test.  . Take metoprolol (Lopressor) two hours prior to test. . FEMALES- please wear underwire-free bra if available         -Take metoprolol (Lopressor) 2 hours prior to test (if applicable).         After the Test: . Drink plenty of water. . After receiving IV contrast, you may experience a mild flushed feeling. This is normal. . On occasion, you may experience a mild rash up to 24 hours after the test. This is not dangerous. If this occurs, you can take Benadryl 25 mg and increase your fluid intake. . If you experience trouble breathing, this can be serious. If it is severe call 911 IMMEDIATELY. If it is mild, please call our office. . If you take any of these medications: Glipizide/Metformin, Avandament, Glucavance, please do not take 48 hours after completing test unless otherwise instructed.   Once we have confirmed authorization from your insurance company, we   will call you to set up a date and time for your test.   For non-scheduling related questions, please contact the cardiac imaging nurse navigator should you have any questions/concerns: Sara Wallace, Cardiac Imaging Nurse Navigator Merle Tai, Interim Cardiac Imaging Nurse Navigator [facility] Direct Office Dial: [phone]   For scheduling needs, including cancellations and rescheduling, please call [phone].    .  Follow-Up: At [facility], you and your health needs are our priority.  As part of our continuing mission to provide you with exceptional heart care, we have created designated Provider Care Teams.  These Care Teams include your primary Cardiologist (physician) and Advanced Practice Providers (APPs -  Physician Assistants and Nurse Practitioners) who all work together to provide you with the care you need, when you need it.  We recommend signing up for the patient portal called "MyChart".  Sign up information is provided on this After Visit Summary.  MyChart is used to connect with patients for Virtual Visits (Telemedicine).  Patients are able to view lab/test results, encounter notes, upcoming appointments, etc.  Non-urgent messages can be sent to your provider as well.   To learn more about what you can do with MyChart, go to [email or web address].    Your next appointment:   6 week(s)  The format for your next appointment:   In Person  Provider:   Robert Krasowski, MD   Other Instructions   Echocardiogram An echocardiogram is a procedure that uses painless sound waves (ultrasound) to produce an image of the heart. Images from an echocardiogram can provide important information about:  Signs of coronary artery disease (CAD).  Aneurysm detection. An aneurysm is a weak or damaged part of an artery wall that bulges out from the normal force of blood pumping through the body.  Heart size and shape. Changes in the size or shape of the heart can be associated with certain conditions, including heart failure, aneurysm, and CAD.  Heart muscle function.  Heart valve function.  Signs of a past heart attack.  Fluid buildup around the heart.  Thickening of the heart muscle.  A tumor or infectious growth around the heart valves. Tell a health care provider about:  Any allergies you have.  All medicines you are taking, including vitamins, herbs, eye drops, creams, and over-the-counter  medicines.  Any blood disorders you have.  Any surgeries you have had.  Any medical conditions you have.  Whether you are pregnant or may be pregnant. What are the risks? Generally, this is a safe procedure. However, problems may occur, including:  Allergic reaction to dye (contrast) that may be used during the procedure. What happens before the procedure? No specific preparation is needed. You may eat and drink normally. What happens during the procedure?   An IV tube may be inserted into one of your veins.  You may receive contrast through this tube. A contrast is an injection that improves the quality of the pictures from your heart.  A gel will be applied to your chest.  A wand-like tool (transducer) will be moved over your chest. The gel will help to transmit the sound waves from the transducer.  The sound waves will harmlessly bounce off of your heart to allow the heart images to be captured in real-time motion. The images will be recorded on a computer. The procedure may vary among health care providers and hospitals. What happens after the procedure?  You may return to your normal, everyday life,   including diet, activities, and medicines, unless your health care provider tells you not to do that. Summary  An echocardiogram is a procedure that uses painless sound waves (ultrasound) to produce an image of the heart.  Images from an echocardiogram can provide important information about the size and shape of your heart, heart muscle function, heart valve function, and fluid buildup around your heart.  You do not need to do anything to prepare before this procedure. You may eat and drink normally.  After the echocardiogram is completed, you may return to your normal, everyday life, unless your health care provider tells you not to do that. This information is not intended to replace advice given to you by your health care provider. Make sure you discuss any questions you  have with your health care provider. Document Revised: 08/27/2018 Document Reviewed: 06/08/2016 Elsevier Patient Education  [place].   Cardiac CT Angiogram A cardiac CT angiogram is a procedure to look at the heart and the area around the heart. It may be done to help find the cause of chest pains or other symptoms of heart disease. During this procedure, a substance called contrast dye is injected into the blood vessels in the area to be checked. A large X-ray machine, called a CT scanner, then takes detailed pictures of the heart and the surrounding area. The procedure is also sometimes called a coronary CT angiogram, coronary artery scanning, or CTA. A cardiac CT angiogram allows the health care provider to see how well blood is flowing to and from the heart. The health care provider will be able to see if there are any problems, such as:  Blockage or narrowing of the coronary arteries in the heart.  Fluid around the heart.  Signs of weakness or disease in the muscles, valves, and tissues of the heart. Tell a health care provider about:  Any allergies you have. This is especially important if you have had a previous allergic reaction to contrast dye.  All medicines you are taking, including vitamins, herbs, eye drops, creams, and over-the-counter medicines.  Any blood disorders you have.  Any surgeries you have had.  Any medical conditions you have.  Whether you are pregnant or may be pregnant.  Any anxiety disorders, chronic pain, or other conditions you have that may increase your stress or prevent you from lying still. What are the risks? Generally, this is a safe procedure. However, problems may occur, including:  Bleeding.  Infection.  Allergic reactions to medicines or dyes.  Damage to other structures or organs.  Kidney damage from the contrast dye that is used.  Increased risk of cancer from radiation exposure. This risk is low. Talk with your health  care provider about: ? The risks and benefits of testing. ? How you can receive the lowest dose of radiation. What happens before the procedure?  Wear comfortable clothing and remove any jewelry, glasses, dentures, and hearing aids.  Follow instructions from your health care provider about eating and drinking. This may include: ? For 12 hours before the procedure -- avoid caffeine. This includes tea, coffee, soda, energy drinks, and diet pills. Drink plenty of water or other fluids that do not have caffeine in them. Being well hydrated can prevent complications. ? For 4-6 hours before the procedure -- stop eating and drinking. The contrast dye can cause nausea, but this is less likely if your stomach is empty.  Ask your health care provider about changing or stopping your regular medicines. This  is especially important if you are taking diabetes medicines, blood thinners, or medicines to treat problems with erections (erectile dysfunction). What happens during the procedure?   Hair on your chest may need to be removed so that small sticky patches called electrodes can be placed on your chest. These will transmit information that helps to monitor your heart during the procedure.  An IV will be inserted into one of your veins.  You might be given a medicine to control your heart rate during the procedure. This will help to ensure that good images are obtained.  You will be asked to lie on an exam table. This table will slide in and out of the CT machine during the procedure.  Contrast dye will be injected into the IV. You might feel warm, or you may get a metallic taste in your mouth.  You will be given a medicine called nitroglycerin. This will relax or dilate the arteries in your heart.  The table that you are lying on will move into the CT machine tunnel for the scan.  The person running the machine will give you instructions while the scans are being done. You may be asked to: ? Keep  your arms above your head. ? Hold your breath. ? Stay very still, even if the table is moving.  When the scanning is complete, you will be moved out of the machine.  The IV will be removed. The procedure may vary among health care providers and hospitals. What can I expect after the procedure? After your procedure, it is common to have:  A metallic taste in your mouth from the contrast dye.  A feeling of warmth.  A headache from the nitroglycerin. Follow these instructions at home:  Take over-the-counter and prescription medicines only as told by your health care provider.  If you are told, drink enough fluid to keep your urine pale yellow. This will help to flush the contrast dye out of your body.  Most people can return to their normal activities right after the procedure. Ask your health care provider what activities are safe for you.  It is up to you to get the results of your procedure. Ask your health care provider, or the department that is doing the procedure, when your results will be ready.  Keep all follow-up visits as told by your health care provider. This is important. Contact a health care provider if:  You have any symptoms of allergy to the contrast dye. These include: ? Shortness of breath. ? Rash or hives. ? A racing heartbeat. Summary  A cardiac CT angiogram is a procedure to look at the heart and the area around the heart. It may be done to help find the cause of chest pains or other symptoms of heart disease.  During this procedure, a large X-ray machine, called a CT scanner, takes detailed pictures of the heart and the surrounding area after a contrast dye has been injected into blood vessels in the area.  Ask your health care provider about changing or stopping your regular medicines before the procedure. This is especially important if you are taking diabetes medicines, blood thinners, or medicines to treat erectile dysfunction.  If you are told,  drink enough fluid to keep your urine pale yellow. This will help to flush the contrast dye out of your body. This information is not intended to replace advice given to you by your health care provider. Make sure you discuss any questions you have with your   health care provider. Document Revised: 12/30/2018 Document Reviewed: 12/30/2018 Elsevier Patient Education  [place].

## 2019-10-08 NOTE — Progress Notes (Signed)
Cardiology Consultation:    Date:  10/08/2019   ID:  Wylma, Sotak 01-02-1946, MRN [id number]  PCP:  Hulan Fess, MD  Cardiologist:  Jenne Campus, MD   Referring MD: Hulan Fess, MD   Chief Complaint  Patient presents with  . New Patient (Initial Visit)  Had some irregularity of my heartbeats  History of Present Illness:    Lorraine Buckley is a 74 y.o. female who is being seen today for the evaluation of extrasystole at the request of Little, Lennette Bihari, MD.  Noland Fordyce is quite interesting few months ago she got situation but for short.  Time close to 1 hour she got altered awareness.  She did not know where she was she was asking the same question repeatedly to her husband she did not remember what was happening at the time.  She got complete amnesia of that episode.  Quite extensive evaluation being done and still some in progress.  She is being seen by a neurologist, so far work-up is unrevealing during neurologist visit she was noted to have some irregular heartbeat and she was referred to [place] for evaluation.  Overall she said she never had any heart trouble.  She denies having a palpitations there is no dizziness no passing out.  She described however to have some chest pain that happens sometimes when she tried to walk a push herself hard.  She thinks is related to asthma that she has no usually is relieved by rest or by simply continuation of exercise.  Has been gone for last few years. She does have dyslipidemia She never smoked Does not have family history of premature coronary artery disease She does not exercise on a regular basis  Past Medical History:  Diagnosis Date  . Acute memory impairment   . Arthritis    FINGERS AND NECK HURT  . Asthma    ALLERGY INDUCED ASTHMA  . Gallstones    ABD PAIN, BAD GAS. FREQ STOOLS  . Heart murmur    PT NOT SURE IF MURMUR STILL HEARD - DOES NOT CAUSE ANY SYMPTOMS  . Hyperlipidemia   . Hypertension   . Hypothyroidism   .  Pneumonia    ABOUT FEB 2013  . PONV (postoperative nausea and vomiting)   . Shortness of breath dyspnea    OCCAS SOB  . Thyroid disease   . Vitamin D deficiency     Past Surgical History:  Procedure Laterality Date  . ABDOMINAL HYSTERECTOMY    . BUNIONECTOMY     RIGHT BIG TOE  . CHOLECYSTECTOMY N/A 03/25/2014   Procedure: LAPAROSCOPIC CHOLECYSTECTOMY WITH INTRAOPERATIVE CHOLANGIOGRAM;  Surgeon: Armandina Gemma, MD;  Location: [facility];  Service: General;  Laterality: N/A;  . EYE SURGERY     LASIK EYE SURGERY  . TUBAL LIGATION      Current Medications: Current Meds  Medication Sig  . albuterol (PROVENTIL HFA;VENTOLIN HFA) 108 (90 BASE) MCG/ACT inhaler Inhale 2 puffs into the lungs every 4 (four) hours as needed. For breathing  . amLODipine (NORVASC) 2.5 MG tablet Take 2.5 mg by mouth every morning.   . ASPIRIN 81 PO Take by mouth.  . cholecalciferol (VITAMIN D) 1000 UNITS tablet Take 1,000 Units by mouth daily.  . fexofenadine (ALLEGRA) 180 MG tablet Take 180 mg by mouth daily.  . fluticasone furoate-vilanterol (BREO ELLIPTA) 200-25 MCG/INH AEPB Inhale 1 puff into the lungs daily.  [name] levothyroxine (SYNTHROID, LEVOTHROID) 100 MCG tablet Take 100 mcg by mouth daily before breakfast.   .  Multiple Vitamin (MULTIVITAMIN PO) Take by mouth.  . RESTASIS 0.05 % ophthalmic emulsion Place 1 drop into both eyes daily.      Allergies:   Penicillins   Social History   Socioeconomic History  . Marital status: Married    Spouse name: Not on file  . Number of children: Not on file  . Years of education: Not on file  . Highest education level: Not on file  Occupational History  . Not on file  Tobacco Use  . Smoking status: Never Smoker  . Smokeless tobacco: Never Used  Substance and Sexual Activity  . Alcohol use: No  . Drug use: No  . Sexual activity: Not Currently  Other Topics Concern  . Not on file  Social History Narrative  . Not on file   Social Determinants of Health    Financial Resource Strain:   . Difficulty of Paying Living Expenses:   Food Insecurity:   . Worried About Charity fundraiser in the Last Year:   . Arboriculturist in the Last Year:   Transportation Needs:   . Film/video editor (Medical):   [name] Lack of Transportation (Non-Medical):   Physical Activity:   . Days of Exercise per Week:   . Minutes of Exercise per Session:   Stress:   . Feeling of Stress :   Social Connections:   . Frequency of Communication with Friends and Family:   . Frequency of Social Gatherings with Friends and Family:   . Attends Religious Services:   . Active Member of Clubs or Organizations:   . Attends Archivist Meetings:   [name] Marital Status:      Family History: The patient's family history includes Asthma in her mother; Breast cancer in her maternal grandmother; Emphysema in her maternal grandfather; Heart disease in her maternal grandfather; Hypertension in her mother; Irritable bowel syndrome in her brother, mother, and sister. ROS:   Please see the history of present illness.    All 14 point review of systems negative except as described per history of present illness.  EKGs/Labs/Other Studies Reviewed:    The following studies were reviewed today:   EKG:  EKG is  ordered today.  The ekg ordered today demonstrates sinus rhythm with some sinus arrhythmia some APCs, normal QS complex duration morphology, no ST segment changes  Recent Labs: 08/05/2019: Creatinine, Ser 0.89  Recent Lipid Panel No results found for: CHOL, TRIG, HDL, CHOLHDL, VLDL, LDLCALC, LDLDIRECT  Physical Exam:    VS:  BP 140/84   Pulse 82   Ht 5\' 7"  (1.702 m)   Wt 236 lb (107 kg)   SpO2 96%   BMI 36.96 kg/m     Wt Readings from Last 3 Encounters:  10/08/19 236 lb (107 kg)  09/07/19 235 lb (106.6 kg)  10/14/17 232 lb 3.2 oz (105.3 kg)     GEN:  Well nourished, well developed in no acute distress HEENT: Normal NECK: No JVD; No carotid  bruits LYMPHATICS: No lymphadenopathy CARDIAC: RRR, no murmurs, no rubs, no gallops RESPIRATORY:  Clear to auscultation without rales, wheezing or rhonchi  ABDOMEN: Soft, non-tender, non-distended MUSCULOSKELETAL:  No edema; No deformity  SKIN: Warm and dry NEUROLOGIC:  Alert and oriented x 3 PSYCHIATRIC:  Normal affect   ASSESSMENT:    1. Atypical chest pain   2. Dyslipidemia   3. Transient alteration of awareness    PLAN:    In order of problems listed above:  1. Atypical chest pain somewhat concerning she thinks is bronchial asthma however the fact that she developed chest tightness with exercise make me concerned.  She is already on aspirin which I will continue.  I will schedule her to have coronary CT angio, if insurance company refused to pay for it then we do stress test.  I prefer coronary CT angio because it would be important information in terms of prognosis. Alternate awareness: Work-up being done by neurology.  I doubt very much that this is related to her heart, however, some irregularity of heart beats is concerning and I will ask you to wear Zio patch for about 1 week.  As a part of evaluation I will also ask you to have an echocardiogram to assess left ventricle ejection fraction 3.  Dyslipidemia: I did calculated her 10-year risk for atherosclerotic event it came as high 20.2%.  Therefore, we will need to initiate statin.  She tells me that years ago she tried some statin had some difficulty with it.  Does not remember the name of it I offered her to start Crestor 10 mg daily.  We will follow up fasting lipid profile in the future as well as AST ALT. 4.  We did talk about healthy lifestyle need to exercise on a regular basis which she understands she need to do. 5.  Essential hypertension: This is first visit blood pressure slightly elevated.  We will continue monitoring.  Medication Adjustments/Labs and Tests Ordered: Current medicines are reviewed at length with the  patient today.  Concerns regarding medicines are outlined above.  No orders of the defined types were placed in this encounter.  No orders of the defined types were placed in this encounter.   Signed, Park Liter, MD, [facility]. 10/08/2019 10:39 AM    [place]

## 2019-10-14 ENCOUNTER — Telehealth: Payer: Self-pay | Admitting: Neurology

## 2019-10-14 NOTE — Telephone Encounter (Signed)
Noted  

## 2019-10-14 NOTE — Telephone Encounter (Signed)
Addendum to her result note: I saw her on 09/07/19 and PSG on 10/03/19.

## 2019-10-14 NOTE — Procedures (Signed)
PATIENT'S NAME:  Lorraine Buckley, Lorraine Buckley DOB:      Sep 16, 1945      MR#:    [id number]     DATE OF RECORDING: 10/03/2019 REFERRING M.D.:  Hulan Fess, MD Study Performed:   Baseline Polysomnogram HISTORY: 74 year old woman with a history of asthma, hyperlipidemia, thyroid disease with history of multinodular goiter, vitamin D deficiency, allergic rhinitis, hypertension, vitamin D deficiency, obesity, and impaired fasting glucose, who reports an episode of memory lapse/amnesia. The patient's weight 236 pounds with a height of 67 (inches), resulting in a BMI of 37. kg/m2. The patient's neck circumference measured 14.5 inches.  CURRENT MEDICATIONS: Proventil, Norvasc, Aspirin, Allegra, Synthroid, Restasis   PROCEDURE:  This is a multichannel digital polysomnogram utilizing the Somnostar 11.2 system.  Electrodes and sensors were applied and monitored per AASM Specifications.   EEG, EOG, Chin and Limb EMG, were sampled at 200 Hz.  ECG, Snore and Nasal Pressure, Thermal Airflow, Respiratory Effort, CPAP Flow and Pressure, Oximetry was sampled at 50 Hz. Digital video and audio were recorded.      BASELINE STUDY  Lights Out was at 21:37 and Lights On at 04:42.  Total recording time (TRT) was 426 minutes, with a total sleep time (TST) of 141 minutes.   The patient's sleep latency was 84 minutes, which is delayed. REM latency was 211 minutes, which is delayed. The sleep efficiency was 33.1%, which is markedly reduced.     SLEEP ARCHITECTURE: WASO (Wake after sleep onset) was 149.5 minutes with moderate to severe sleep fragmentation noted and multiple longer periods of wakefulness. There were 27.5 minutes in Stage N1, 56 minutes Stage N2, 44.5 minutes Stage N3 and 13 minutes in Stage REM.  The percentage of Stage N1 was 19.5%, which is markedly increased, Stage N2 was 39.7%, Stage N3 was 31.6%, which is increased, and Stage R (REM sleep) was 9.2%, which is reduced. The arousals were noted as: 38 were spontaneous, 0 were  associated with PLMs, 7 were associated with respiratory events.  RESPIRATORY ANALYSIS:  There were a total of 13 respiratory events:  2 obstructive apneas, 0 central apneas and 0 mixed apneas with a total of 2 apneas and an apnea index (AI) of .9 /hour. There were 11 hypopneas with a hypopnea index of 4.7 /hour. The patient also had 0 respiratory event related arousals (RERAs).      The total APNEA/HYPOPNEA INDEX (AHI) was 5.5/hour and the total RESPIRATORY DISTURBANCE INDEX was  5.5 /hour.  10 events occurred in REM sleep and 6 events in NREM. The REM AHI was  46.2 /hour, versus a non-REM AHI of 1.4. The patient spent 19.5 minutes of total sleep time in the supine position and 122 minutes in non-supine.. The supine AHI was 0.0 versus a non-supine AHI of 6.4.  OXYGEN SATURATION & C02:  The Wake baseline 02 saturation was 96%, with the lowest being 83%. Time spent below 89% saturation equaled 8 minutes.  PERIODIC LIMB MOVEMENTS: The patient had a total of 0 Periodic Limb Movements.  The Periodic Limb Movement (PLM) index was 0 and the PLM Arousal index was 0/hour.  Audio and video analysis did not show any abnormal or unusual movements, behaviors, phonations or vocalizations. The patient took no bathroom breaks. Mild intermittent snoring was noted. The EKG was in keeping with normal sinus rhythm (NSR) with occasional PVCs noted.   Post-study, the patient indicated that sleep was worse than usual.   IMPRESSION:  1. Obstructive Sleep Apnea (OSA) 2. Poor sleep pattern  3. Dysfunctions associated with sleep stages or arousal from sleep 4. Non-specific abnormal EKG  RECOMMENDATIONS:  1. This study demonstrates overall mild obstructive sleep apnea, severe in REM sleep with a total AHI of 5.5/hour, REM AHI of 46.2/hour, and O2 nadir of 83%. The significantly low sleep efficiency and reduced REM sleep likely underestimate her AHI and/or O2 nadir. Given the patient's medical history and sleep related  complaints, treatment with positive airway pressure is recommended; this can be achieved in the form of autoPAP. Alternatively, a full-night CPAP titration study would allow optimization of therapy if needed. Other treatment options may include avoidance of supine sleep position along with weight loss, upper airway or jaw surgery in selected patients or the use of an oral appliance in certain patients. ENT evaluation and/or consultation with a maxillofacial surgeon or dentist may be feasible in some instances.    2. Please note that untreated obstructive sleep apnea may carry additional perioperative morbidity. Patients with significant obstructive sleep apnea should receive perioperative PAP therapy and the surgeons and particularly the anesthesiologist should be informed of the diagnosis and the severity of the sleep disordered breathing. 3. The patient should be cautioned not to drive, work at heights, or operate dangerous or heavy equipment when tired or sleepy. Review and reiteration of good sleep hygiene measures should be pursued with any patient. 4. This study shows sleep fragmentation and abnormal sleep stage percentages; these are nonspecific findings and per se do not signify an intrinsic sleep disorder or a cause for the patient's sleep-related symptoms. Causes include (but are not limited to) the first night effect of the sleep study, circadian rhythm disturbances, medication effect or an underlying mood disorder or medical problem.  5. The study showed occasional PVCs on single lead EKG; clinical correlation is recommended and consultation with cardiology may be feasible.  6. The patient will be seen in follow-up in the sleep clinic at [facility] for discussion of the test results, symptom and treatment compliance review, further management strategies, etc. The referring provider will be notified of the test results.     I certify that I have reviewed the entire raw data recording prior to the  issuance of this report in accordance with the Standards of Accreditation of the American Academy of Sleep Medicine (AASM)   Star Age, MD, PhD Diplomat, American Board of Neurology and Sleep Medicine (Neurology and Sleep Medicine)

## 2019-10-14 NOTE — Progress Notes (Signed)
Patient referred by Dr. Rex Kras for an episode of amnesia, seen by me on 09/08/19, diagnostic PSG on 10/04/19.    Please call and notify the patient that the recent sleep study showed little sleep of less than 3 hours total, but did show overall mild obstructive sleep apnea. It was severe during REM/dream sleep. I believe she would benefit from treatment, given her sleep related complaints of not sleeping well or long enough; she also has a FHx of OSA. To that end, I recommend treatment in the form of autoPAP, which means, that we don't have to bring her back for a second sleep study with CPAP, but will let her try an autoPAP machine at home, through a DME company (of her choice, or as per insurance requirement). The DME representative will educate her on how to use the machine, how to put the mask on, etc. I have placed an order in the chart. Please send referral, talk to patient, send report to referring MD. We will need a FU in sleep clinic for 10 weeks post-PAP set up, please arrange that with me or one of our NPs.   Of note, her EKG during the sleep study showed some extra beats, but she is aware and saw Dr. Agustin Cree in cardiology last week.   Star Age, MD, PhD [facility])

## 2019-10-14 NOTE — Addendum Note (Signed)
Addended by: Star Age on: 10/14/2019 07:34 AM   Modules accepted: Orders

## 2019-10-19 ENCOUNTER — Telehealth: Payer: Self-pay

## 2019-10-19 NOTE — Telephone Encounter (Signed)
I called pt. No answer, left a message asking pt to call me back.   

## 2019-10-19 NOTE — Telephone Encounter (Signed)
-----   Message from Star Age, MD sent at 10/14/2019  7:34 AM EDT ----- Patient referred by Dr. Rex Kras for an episode of amnesia, seen by me on 09/08/19, diagnostic PSG on 10/04/19.    Please call and notify the patient that the recent sleep study showed little sleep of less than 3 hours total, but did show overall mild obstructive sleep apnea. It was severe during REM/dream sleep. I believe she would benefit from treatment, given her sleep related complaints of not sleeping well or long enough; she also has a FHx of OSA. To that end, I recommend treatment in the form of autoPAP, which means, that we don't have to bring her back for a second sleep study with CPAP, but will let her try an autoPAP machine at home, through a DME company (of her choice, or as per insurance requirement). The DME representative will educate her on how to use the machine, how to put the mask on, etc. I have placed an order in the chart. Please send referral, talk to patient, send report to referring MD. We will need a FU in sleep clinic for 10 weeks post-PAP set up, please arrange that with me or one of our NPs.   Of note, her EKG during the sleep study showed some extra beats, but she is aware and saw Dr. Agustin Cree in cardiology last week.   Star Age, MD, PhD [facility])

## 2019-10-19 NOTE — Telephone Encounter (Signed)
Pt has called Megan,RN back. Please call. 

## 2019-10-19 NOTE — Telephone Encounter (Signed)
I called pt. I advised pt that Dr. Rexene Alberts reviewed their sleep study results and found that pt overall mild osa with severe osa noted in the REM stage of sleep. Dr. Rexene Alberts recommends that pt start an autopap for treatment at home. I reviewed PAP compliance expectations with the pt. Pt is agreeable to starting an auto-PAP. I advised pt that an order will be sent to a DME, [facility], and [facility] will call the pt within about one week after they file with the pt's insurance. [facility] will show the pt how to use the machine, fit for masks, and troubleshoot the auto-PAP if needed. A follow up appt was made for insurance purposes with Dr. Rexene Alberts on 12/20/2019 at 1030 am. Pt verbalized understanding to arrive 15 minutes early and bring their auto-PAP. A letter with all of this information in it will be mailed to the pt as a reminder. I verified with the pt that the address we have on file is correct. Pt verbalized understanding of results. Pt had no questions at this time but was encouraged to call back if questions arise. I have sent the order to [facility] and have received confirmation that they have received the order.

## 2019-10-20 ENCOUNTER — Encounter: Payer: Self-pay | Admitting: Cardiology

## 2019-10-21 NOTE — Progress Notes (Signed)
Please call and advise the patient that the EEG or brain wave test we performed was reported as normal in the awake state. We checked for abnormal electrical discharges in the brain waves and the report suggested normal findings. No further action is required on this test at this time. Please remind patient to keep any upcoming appointments or tests and to call us with any interim questions, concerns, problems or updates.   Also, on the single-channel EKG she was noted to have some irregularity in her heart rhythm.  Of note, she has been seen by cardiology for this recently.  Thanks,  Star Age, MD, PhD

## 2019-10-21 NOTE — Procedures (Signed)
   HISTORY: 74 year old female presented with episode of sudden onset confusion  TECHNIQUE:  This is a routine 16 channel EEG recording with one channel devoted to a limited EKG recording.  It was performed during wakefulness, drowsiness and asleep.  Hyperventilation and photic stimulation were performed as activating procedures.  There are minimum muscle and movement artifact noted.  Upon maximum arousal, posterior dominant waking rhythm consistent of rhythmic alpha range activity, with frequency of 9-10 hz. Activities are symmetric over the bilateral posterior derivations and attenuated with eye opening.  Hyperventilation produced mild/moderate buildup with higher amplitude and the slower activities noted.  Photic stimulation did not alter the tracing.  During EEG recording, patient developed drowsiness and no deeper stage of sleep was achieved During EEG recording, there was no epileptiform discharge noted.  EKG demonstrate irregular cardiac rhythm 76 bpm  CONCLUSION: This is a  normal EEG.  There is no electrodiagnostic evidence of epileptiform discharge.  But the EEG demonstrated irregular cardiac rhythm.  Marcial Pacas, M.D. Ph.D.  [facility] [place], [place] [address] Phone: [phone] Fax:      [phone]

## 2019-10-25 ENCOUNTER — Other Ambulatory Visit: Payer: Self-pay

## 2019-10-25 ENCOUNTER — Ambulatory Visit (HOSPITAL_BASED_OUTPATIENT_CLINIC_OR_DEPARTMENT_OTHER)
Admission: RE | Admit: 2019-10-25 | Discharge: 2019-10-25 | Disposition: A | Discharge status: Home / Self Care | Payer: PPO | Source: Ambulatory Visit | Attending: Cardiology | Admitting: Cardiology

## 2019-10-25 DIAGNOSIS — R0789 Other chest pain: Secondary | ICD-10-CM

## 2019-10-25 NOTE — Progress Notes (Signed)
°  Echocardiogram 2D Echocardiogram has been performed.  Lorraine Buckley F 10/25/2019, 10:43 AM

## 2019-10-26 ENCOUNTER — Ambulatory Visit (INDEPENDENT_AMBULATORY_CARE_PROVIDER_SITE_OTHER): Payer: PPO

## 2019-10-26 ENCOUNTER — Telehealth: Payer: Self-pay

## 2019-10-26 DIAGNOSIS — I4949 Other premature depolarization: Secondary | ICD-10-CM

## 2019-10-26 DIAGNOSIS — R0789 Other chest pain: Secondary | ICD-10-CM

## 2019-10-26 NOTE — Telephone Encounter (Signed)
-----   Message from Park Liter, MD sent at 10/26/2019 10:15 AM EDT ----- Echo - ef 45-50%, mildly diminished, rest looks good, will discuss during the visit

## 2019-10-26 NOTE — Telephone Encounter (Signed)
The patient has been notified of the Echo result and verbalized understanding.  All questions (if any) were answered. Frederik Schmidt, RN 10/26/2019 4:21 PM

## 2019-10-27 ENCOUNTER — Telehealth: Payer: Self-pay

## 2019-10-27 NOTE — Telephone Encounter (Signed)
-----   Message from Star Age, MD sent at 10/21/2019  7:03 PM EDT ----- Please call and advise the patient that the EEG or brain wave test we performed was reported as normal in the awake state. We checked for abnormal electrical discharges in the brain waves and the report suggested normal findings. No further action is required on this test at this time. Please remind patient to keep any upcoming appointments or tests and to call us with any interim questions, concerns, problems or updates.   Also, on the single-channel EKG she was noted to have some irregularity in her heart rhythm.  Of note, she has been seen by cardiology for this recently.  Thanks,  Star Age, MD, PhD

## 2019-10-27 NOTE — Telephone Encounter (Signed)
Pt notified of results and verbalized understanding.  Pt did make mention she has not heard from [facility] as of today about starting her pap machine.  Message sent to [facility] asking them to f/u.

## 2019-11-15 DIAGNOSIS — G4733 Obstructive sleep apnea (adult) (pediatric): Secondary | ICD-10-CM | POA: Diagnosis not present

## 2019-11-19 DIAGNOSIS — R9431 Abnormal electrocardiogram [ECG] [EKG]: Secondary | ICD-10-CM | POA: Diagnosis not present

## 2019-11-23 ENCOUNTER — Ambulatory Visit: Payer: PPO | Admitting: Cardiology

## 2019-11-24 ENCOUNTER — Ambulatory Visit: Payer: PPO | Admitting: Cardiology

## 2019-11-24 ENCOUNTER — Other Ambulatory Visit: Payer: Self-pay

## 2019-11-24 ENCOUNTER — Encounter: Payer: Self-pay | Admitting: Cardiology

## 2019-11-24 VITALS — BP 148/78 | HR 84 | Ht 67.0 in | Wt 240.0 lb

## 2019-11-24 DIAGNOSIS — R0789 Other chest pain: Secondary | ICD-10-CM | POA: Diagnosis not present

## 2019-11-24 DIAGNOSIS — E785 Hyperlipidemia, unspecified: Secondary | ICD-10-CM

## 2019-11-24 DIAGNOSIS — R404 Transient alteration of awareness: Secondary | ICD-10-CM

## 2019-11-24 MED ORDER — METOPROLOL TARTRATE 25 MG PO TABS
25.0000 mg | ORAL_TABLET | Freq: Two times a day (BID) | ORAL | 3 refills | Status: DC
Start: 2019-11-24 — End: 2020-01-17

## 2019-11-24 MED ORDER — ROSUVASTATIN CALCIUM 10 MG PO TABS
10.0000 mg | ORAL_TABLET | Freq: Every day | ORAL | 3 refills | Status: DC
Start: 2019-11-24 — End: 2020-08-14

## 2019-11-24 NOTE — Progress Notes (Signed)
Cardiology Office Note:    Date:  11/24/2019   ID:  Lorraine Buckley, Lorraine Buckley 07-Aug-1945, MRN [id number]  PCP:  Hulan Fess, MD  Cardiologist:  Jenne Campus, MD    Referring MD: Hulan Fess, MD   No chief complaint on file. I am feeling better  History of Present Illness:    Lorraine Buckley is a 74 y.o. female who was referred to [place] because of multiple issues for several she does have episode of altered awareness, that being worked up by neurology.  So far no clear-cut etiology for this phenomenon identified.  She does have some palpitations and that is why we took monitoring on her which revealed multiple runs of supraventricular tachycardia but no clear-cut atrial fibrillation identified.  She also had echocardiogram done which show surprisingly diminished left ventricle ejection fraction 45 to 50%.  She denies having any extraordinary shortness of breath or swelling of lower extremities but we need to treat her on appropriate guideline directed medical therapy.  Past Medical History:  Diagnosis Date  . Acute memory impairment   . Arthritis    FINGERS AND NECK HURT  . Asthma    ALLERGY INDUCED ASTHMA  . Gallstones    ABD PAIN, BAD GAS. FREQ STOOLS  . Heart murmur    PT NOT SURE IF MURMUR STILL HEARD - DOES NOT CAUSE ANY SYMPTOMS  . Hyperlipidemia   . Hypertension   . Hypothyroidism   . Pneumonia    ABOUT FEB 2013  . PONV (postoperative nausea and vomiting)   . Shortness of breath dyspnea    OCCAS SOB  . Thyroid disease   . Vitamin D deficiency     Past Surgical History:  Procedure Laterality Date  . ABDOMINAL HYSTERECTOMY    . BUNIONECTOMY     RIGHT BIG TOE  . CHOLECYSTECTOMY N/A 03/25/2014   Procedure: LAPAROSCOPIC CHOLECYSTECTOMY WITH INTRAOPERATIVE CHOLANGIOGRAM;  Surgeon: Armandina Gemma, MD;  Location: [facility];  Service: General;  Laterality: N/A;  . EYE SURGERY     LASIK EYE SURGERY  . TUBAL LIGATION      Current Medications: Current Meds  Medication Sig    . albuterol (PROVENTIL HFA;VENTOLIN HFA) 108 (90 BASE) MCG/ACT inhaler Inhale 2 puffs into the lungs every 4 (four) hours as needed. For breathing  . amLODipine (NORVASC) 2.5 MG tablet Take 2.5 mg by mouth every morning.   . ASPIRIN 81 PO Take by mouth.  . cholecalciferol (VITAMIN D) 1000 UNITS tablet Take 1,000 Units by mouth daily.  . fexofenadine (ALLEGRA) 180 MG tablet Take 180 mg by mouth daily.  . fluticasone furoate-vilanterol (BREO ELLIPTA) 200-25 MCG/INH AEPB Inhale 1 puff into the lungs daily.  [name] levothyroxine (SYNTHROID, LEVOTHROID) 100 MCG tablet Take 100 mcg by mouth daily before breakfast.   . Multiple Vitamin (MULTIVITAMIN PO) Take by mouth.  . RESTASIS 0.05 % ophthalmic emulsion Place 1 drop into both eyes daily.      Allergies:   Penicillins   Social History   Socioeconomic History  . Marital status: Married    Spouse name: Not on file  . Number of children: Not on file  . Years of education: Not on file  . Highest education level: Not on file  Occupational History  . Not on file  Tobacco Use  . Smoking status: Never Smoker  . Smokeless tobacco: Never Used  Substance and Sexual Activity  . Alcohol use: No  . Drug use: No  . Sexual activity: Not Currently  Other Topics Concern  . Not on file  Social History Narrative  . Not on file   Social Determinants of Health   Financial Resource Strain:   . Difficulty of Paying Living Expenses:   Food Insecurity:   . Worried About Charity fundraiser in the Last Year:   . Arboriculturist in the Last Year:   Transportation Needs:   . Film/video editor (Medical):   [name] Lack of Transportation (Non-Medical):   Physical Activity:   . Days of Exercise per Week:   . Minutes of Exercise per Session:   Stress:   . Feeling of Stress :   Social Connections:   . Frequency of Communication with Friends and Family:   . Frequency of Social Gatherings with Friends and Family:   . Attends Religious Services:   . Active  Member of Clubs or Organizations:   . Attends Archivist Meetings:   [name] Marital Status:      Family History: The patient's family history includes Asthma in her mother; Breast cancer in her maternal grandmother; Emphysema in her maternal grandfather; Heart disease in her maternal grandfather; Hypertension in her mother; Irritable bowel syndrome in her brother, mother, and sister. ROS:   Please see the history of present illness.    All 14 point review of systems negative except as described per history of present illness  EKGs/Labs/Other Studies Reviewed:      Recent Labs: 08/05/2019: Creatinine, Ser 0.89  Recent Lipid Panel No results found for: CHOL, TRIG, HDL, CHOLHDL, VLDL, LDLCALC, LDLDIRECT  Physical Exam:    VS:  BP (!) 148/78 (BP Location: Right Arm, Patient Position: Sitting, Cuff Size: Large)   Pulse 84   Ht 5\' 7"  (1.702 m)   Wt 240 lb (108.9 kg)   SpO2 94%   BMI 37.59 kg/m     Wt Readings from Last 3 Encounters:  11/24/19 240 lb (108.9 kg)  10/08/19 236 lb (107 kg)  09/07/19 235 lb (106.6 kg)     GEN:  Well nourished, well developed in no acute distress HEENT: Normal NECK: No JVD; No carotid bruits LYMPHATICS: No lymphadenopathy CARDIAC: RRR, no murmurs, no rubs, no gallops RESPIRATORY:  Clear to auscultation without rales, wheezing or rhonchi  ABDOMEN: Soft, non-tender, non-distended MUSCULOSKELETAL:  No edema; No deformity  SKIN: Warm and dry LOWER EXTREMITIES: no swelling NEUROLOGIC:  Alert and oriented x 3 PSYCHIATRIC:  Normal affect   ASSESSMENT:    1. Atypical chest pain   2. Dyslipidemia   3. Transient alteration of awareness    PLAN:    In order of problems listed above:  1. Atypical chest pain she is awaiting coronary CT angio which is within the next few days. 2. Dyslipidemia: I did review K PN and we will start her with Crestor 10 mg daily. 3. Transient alternation of awareness that being worked up by mom neurology.  So far no  clear-cut cardiac explanation for the symptoms. 4. 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  She is scheduled to have coronary CT angio will wait for results of it.  In the meantime we will continue present management. 5.    Medication Adjustments/Labs and Tests Ordered: Current medicines are reviewed at length with the patient today.  Concerns regarding medicines are outlined above.  No orders of the defined types were placed in this encounter.  Medication changes: No orders of the defined types were placed in this encounter.   Signed, Park Liter,  MD, [facility] 11/24/2019 4:01 PM    [facility]

## 2019-11-24 NOTE — Patient Instructions (Signed)
Medication Instructions:  Your physician has recommended you make the following change in your medication:  1. START METOPROLOL 25 MG TWICE DAILY.  2. START CRESTOR 10 MG DAILY.  *If you need a refill on your cardiac medications before your next appointment, please call your pharmacy*  Lab Work: TODAY: BMET If you have labs (blood work) drawn today and your tests are completely normal, you will receive your results only by: [name] MyChart Message (if you have MyChart) OR . A paper copy in the mail If you have any lab test that is abnormal or we need to change your treatment, we will call you to review the results.  Testing/Procedures: NONE  Follow-Up: At [facility], you and your health needs are our priority.  As part of our continuing mission to provide you with exceptional heart care, we have created designated Provider Care Teams.  These Care Teams include your primary Cardiologist (physician) and Advanced Practice Providers (APPs -  Physician Assistants and Nurse Practitioners) who all work together to provide you with the care you need, when you need it.  We recommend signing up for the patient portal called "MyChart".  Sign up information is provided on this After Visit Summary.  MyChart is used to connect with patients for Virtual Visits (Telemedicine).  Patients are able to view lab/test results, encounter notes, upcoming appointments, etc.  Non-urgent messages can be sent to your provider as well.   To learn more about what you can do with MyChart, go to [email or web address].    Your next appointment:   6 week(s)  The format for your next appointment:   In Person  Provider:   Jenne Campus, MD

## 2019-11-25 LAB — BASIC METABOLIC PANEL
BUN/Creatinine Ratio: 21 (ref 12–28)
BUN: 17 mg/dL (ref 8–27)
CO2: 24 mmol/L (ref 20–29)
Calcium: 9.6 mg/dL (ref 8.7–10.3)
Chloride: 102 mmol/L (ref 96–106)
Creatinine, Ser: 0.8 mg/dL (ref 0.57–1.00)
GFR calc Af Amer: 85 mL/min/{1.73_m2} (ref >59)
GFR calc non Af Amer: 73 mL/min/{1.73_m2} (ref >59)
Glucose: 100 mg/dL — ABNORMAL HIGH (ref 65–99)
Potassium: 4.4 mmol/L (ref 3.5–5.2)
Sodium: 140 mmol/L (ref 134–144)

## 2019-12-03 ENCOUNTER — Telehealth (HOSPITAL_COMMUNITY): Payer: Self-pay | Admitting: *Deleted

## 2019-12-03 NOTE — Telephone Encounter (Signed)

## 2019-12-06 ENCOUNTER — Other Ambulatory Visit: Payer: Self-pay

## 2019-12-06 ENCOUNTER — Ambulatory Visit (HOSPITAL_COMMUNITY)
Admission: RE | Admit: 2019-12-06 | Discharge: 2019-12-06 | Disposition: A | Discharge status: Home / Self Care | Payer: PPO | Source: Ambulatory Visit | Attending: Cardiology | Admitting: Cardiology

## 2019-12-06 ENCOUNTER — Telehealth: Payer: Self-pay

## 2019-12-06 DIAGNOSIS — R0789 Other chest pain: Secondary | ICD-10-CM | POA: Diagnosis not present

## 2019-12-06 DIAGNOSIS — I2510 Atherosclerotic heart disease of native coronary artery without angina pectoris: Secondary | ICD-10-CM | POA: Insufficient documentation

## 2019-12-06 MED ORDER — IOHEXOL 350 MG/ML SOLN
80.0000 mL | Freq: Once | INTRAVENOUS | Status: AC | PRN
  Administered 2019-12-06: 80 mL via INTRAVENOUS

## 2019-12-06 MED ORDER — NITROGLYCERIN 0.4 MG SL SUBL
SUBLINGUAL_TABLET | SUBLINGUAL | Status: AC
  Filled 2019-12-06: qty 2

## 2019-12-06 MED ORDER — NITROGLYCERIN 0.4 MG SL SUBL
0.8000 mg | SUBLINGUAL_TABLET | Freq: Once | SUBLINGUAL | Status: AC
  Administered 2019-12-06: 0.8 mg via SUBLINGUAL

## 2019-12-06 NOTE — Telephone Encounter (Signed)
-----   Message from Park Liter, MD sent at 12/06/2019  8:33 AM EDT ----- Monitor showed frequent supraventricular ectopy, also SVT.  We will talk about this during next visit

## 2019-12-06 NOTE — Telephone Encounter (Signed)
Spoke with patient regarding results and recommendation.  Patient verbalizes understanding and is agreeable to plan of care. Advised patient to call back with any issues or concerns.  

## 2019-12-06 NOTE — Progress Notes (Signed)
CT scan completed. Tolerated well. D/C home ambulatory, awake and alert. In no distress. 

## 2019-12-07 ENCOUNTER — Ambulatory Visit: Payer: PPO | Admitting: Neurology

## 2019-12-13 ENCOUNTER — Telehealth: Payer: Self-pay | Admitting: Cardiology

## 2019-12-13 NOTE — Telephone Encounter (Signed)
Patient is returning call to discuss results from CT completed on 12/06/19.

## 2019-12-13 NOTE — Telephone Encounter (Signed)
Called patient informed her of results.  

## 2019-12-15 DIAGNOSIS — G4733 Obstructive sleep apnea (adult) (pediatric): Secondary | ICD-10-CM | POA: Diagnosis not present

## 2019-12-20 ENCOUNTER — Ambulatory Visit: Payer: Self-pay | Admitting: Neurology

## 2020-01-06 ENCOUNTER — Ambulatory Visit: Payer: PPO | Admitting: Neurology

## 2020-01-06 ENCOUNTER — Encounter: Payer: Self-pay | Admitting: Neurology

## 2020-01-06 VITALS — BP 126/80 | Ht 67.0 in | Wt 239.3 lb

## 2020-01-06 DIAGNOSIS — G4733 Obstructive sleep apnea (adult) (pediatric): Secondary | ICD-10-CM

## 2020-01-06 DIAGNOSIS — Z9989 Dependence on other enabling machines and devices: Secondary | ICD-10-CM

## 2020-01-06 DIAGNOSIS — R4189 Other symptoms and signs involving cognitive functions and awareness: Secondary | ICD-10-CM

## 2020-01-06 NOTE — Patient Instructions (Signed)
Please continue using your autoPAP regularly. While your insurance requires that you use PAP at least 4 hours each night on 70% of the nights, I recommend, that you not skip any nights and use it throughout the night if you can. Getting used to PAP and staying with the treatment long term does take time and patience and discipline. Untreated obstructive sleep apnea when it is moderate to severe can have an adverse impact on cardiovascular health and raise her risk for heart disease, arrhythmias, hypertension, congestive heart failure, stroke and diabetes. Untreated obstructive sleep apnea causes sleep disruption, nonrestorative sleep, and sleep deprivation. This can have an impact on your day to day functioning and cause daytime sleepiness and impairment of cognitive function, memory loss, mood disturbance, and problems focussing. Using PAP regularly can improve these symptoms.  You have done a great job using your AutoPap machine.  Sometimes it takes a few months to get used to it and reap full benefit from it.  It may even help you with your palpitations.  As discussed, we will do a follow-up in 6 months, we can try to do a virtual visit with one of our nurse practitioners through the [place] video visit.   Your EEG did not show any abnormalities and you had a benign brain MRI in March.

## 2020-01-06 NOTE — Progress Notes (Signed)
Subjective:    Patient ID: Lorraine Buckley is a 74 y.o. female.  HPI     Interim history:   Lorraine Buckley is a 74 year old right-handed woman with an underlying medical history of asthma, hyperlipidemia, thyroid disease with history of multinodular goiter, vitamin D deficiency, allergic rhinitis, hypertension, vitamin D deficiency, obesity, and impaired fasting glucose, who Presents for follow-up consultation of Lorraine Buckley episode of confusion which happened in March including a lapse in Lorraine Buckley memory and amnesia.  Lorraine Buckley also follows up for Lorraine Buckley recent diagnosis of obstructive sleep apnea after Lorraine Buckley baseline sleep study from May 2021.  Patient is unaccompanied today.  I first met Lorraine Buckley on 09/07/2019 at the request of Lorraine Buckley primary care physician, at which time Lorraine Buckley reported an episode in mid March of memory lapse and amnesia, confusion lasting about 15 minutes.  Lorraine Buckley had a nonfocal neurological exam but Lorraine Buckley did report some sleep difficulties.  Lorraine Buckley was advised to proceed with further testing in the form of EEG, carotid Doppler ultrasound and sleep study.  Lorraine Buckley EEG in May was found to be normal, Lorraine Buckley was advised of the test results.  Lorraine Buckley did not have Lorraine Buckley carotid Doppler ultrasound done.  Lorraine Buckley sleep study from 10/03/2019 showed overall mild obstructive sleep apnea but poor sleep efficiency and reduced REM sleep.  Lorraine Buckley AHI was 5.5/h, REM AHI was 46.2/h and O2 nadir 83%.  Based on Lorraine Buckley symptoms, and Lorraine Buckley medical history Lorraine Buckley was advised to proceed with treatment in the form of AutoPap therapy.  Today, 01/06/2020: I reviewed Lorraine Buckley AutoPap compliance data from 12/06/2019 through 01/04/2020, which is a total of 30 days, during which time Lorraine Buckley used Lorraine Buckley machine 29 days with percent use days greater than 4 hours at 87%, indicating very good compliance with an average usage of 6 hours and 11 minutes, residual AHI at goal at 0.8/h, 95th percentile of pressure at 9.7 cm with a range of 5 to 11 cm with EPR.  Leak acceptable with a 95th percentile at 8.7  L/min.  Lorraine Buckley reports feeling stable.  Lorraine Buckley has not had any new neurological symptoms or episodes of memory impairment.  Lorraine Buckley has had palpitations.  Lorraine Buckley saw cardiology.  Lorraine Buckley has been placed on low-dose metoprolol.  Lorraine Buckley reports that Lorraine Buckley is still adjusting to Lorraine Buckley AutoPap machine.  Lorraine Buckley is motivated to continue with treatment.  Lorraine Buckley is not quite sure if it has helped Lorraine Buckley.  The patient's allergies, current medications, family history, past medical history, past social history, past surgical history and problem list were reviewed and updated as appropriate.   Previously:   09/07/19: (Lorraine Buckley) reports an episode in mid March of memory lapse/amnesia.  Lorraine Buckley had an approximately 15-minute episode while on vacation of confusion, asking the same question over and over about 5 times and not able to retain information.  Lorraine Buckley had no obvious facial droop or slurring of speech or one-sided weakness but did get concerned about having a stroke when Lorraine Buckley had Lorraine Buckley memory return.  Lorraine Buckley has never had an episode like this before or after.  Lorraine Buckley does report that Lorraine Buckley had quite a bit of stress and had recently lost Lorraine Buckley mom in February 2021 at the age of 46.  There is no family history on mother side of dementia.  Lorraine Buckley father lived to be 40 and committed suicide, patient reports that he had chronic pain from arthritis and also alcohol dependence.  Parents were divorced.  Lorraine Buckley recalls that Lorraine Buckley paternal aunts had some memory issues, Lorraine Buckley mom  had some forgetfulness in the last part of Lorraine Buckley life.  Patient reports that Lorraine Buckley had not been sleeping very well.  Lorraine Buckley has chronic difficulty maintaining sleep and may get an average of 6 hours of sleep.  Lorraine Buckley snores some but not consistently and not severely as far as Lorraine Buckley knows. Lorraine Buckley 2 brothers have sleep apnea, one may use a CPAP, the other Lorraine Buckley is not sure.   I reviewed your office note from 08/04/2019.  Lorraine Buckley had a televisit with you at the time. Lorraine Buckley did have a headache at the time and has a history of migraines. You  ordered a brain MRI.  Lorraine Buckley had a brain MRI with and without contrast on 08/05/2019 and I reviewed the results:     IMPRESSION: No evidence of recent infarction, hemorrhage, or mass.   Mild to moderate chronic microvascular ischemic changes.   Lorraine Buckley has nocturia about 2 or 3 times per average night, denies any morning headaches.  Lorraine Buckley is a non-smoker and does not drink alcohol and drinks very little caffeine.    Lorraine Buckley Past Medical History Is Significant For: Past Medical History:  Diagnosis Date   Acute memory impairment    Arthritis    FINGERS AND NECK HURT   Asthma    ALLERGY INDUCED ASTHMA   Gallstones    ABD PAIN, BAD GAS. FREQ STOOLS   Heart murmur    PT NOT SURE IF MURMUR STILL HEARD - DOES NOT CAUSE ANY SYMPTOMS   Hyperlipidemia    Hypertension    Hypothyroidism    Pneumonia    ABOUT FEB 2013   PONV (postoperative nausea and vomiting)    Shortness of breath dyspnea    OCCAS SOB   Thyroid disease    Vitamin D deficiency     Lorraine Buckley Past Surgical History Is Significant For: Past Surgical History:  Procedure Laterality Date   ABDOMINAL HYSTERECTOMY     BUNIONECTOMY     RIGHT BIG TOE   CHOLECYSTECTOMY N/A 03/25/2014   Procedure: LAPAROSCOPIC CHOLECYSTECTOMY WITH INTRAOPERATIVE CHOLANGIOGRAM;  Surgeon: Armandina Gemma, MD;  Location: [facility];  Service: General;  Laterality: N/A;   EYE SURGERY     LASIK EYE SURGERY   TUBAL LIGATION      Lorraine Buckley Family History Is Significant For: Family History  Problem Relation Age of Onset   Asthma Mother    Hypertension Mother    Irritable bowel syndrome Mother    Irritable bowel syndrome Sister    Irritable bowel syndrome Brother    Heart disease Maternal Grandfather    Emphysema Maternal Grandfather    Breast cancer Maternal Grandmother        in 27's    Lorraine Buckley Social History Is Significant For: Social History   Socioeconomic History   Marital status: Married    Spouse name: Not on file   Number of children:  Not on file   Years of education: Not on file   Highest education level: Not on file  Occupational History   Not on file  Tobacco Use   Smoking status: Never Smoker   Smokeless tobacco: Never Used  Substance and Sexual Activity   Alcohol use: No   Drug use: No   Sexual activity: Not Currently  Other Topics Concern   Not on file  Social History Narrative   Not on file   Social Determinants of Health   Financial Resource Strain:    Difficulty of Paying Living Expenses: Not on file  Food Insecurity:  Worried About Charity fundraiser in the Last Year: Not on file   [employer] of Food in the Last Year: Not on file  Transportation Needs:    Lack of Transportation (Medical): Not on file   Lack of Transportation (Non-Medical): Not on file  Physical Activity:    Days of Exercise per Week: Not on file   Minutes of Exercise per Session: Not on file  Stress:    Feeling of Stress : Not on file  Social Connections:    Frequency of Communication with Friends and Family: Not on file   Frequency of Social Gatherings with Friends and Family: Not on file   Attends Religious Services: Not on file   Active Member of Clubs or Organizations: Not on file   Attends Archivist Meetings: Not on file   Marital Status: Not on file    Lorraine Buckley Allergies Are:  Allergies  Allergen Reactions   Penicillins Other (See Comments)    unknown  :   Lorraine Buckley Current Medications Are:  Outpatient Encounter Medications as of 01/06/2020  Medication Sig   albuterol (PROVENTIL HFA;VENTOLIN HFA) 108 (90 BASE) MCG/ACT inhaler Inhale 2 puffs into the lungs every 4 (four) hours as needed. For breathing   amLODipine (NORVASC) 2.5 MG tablet Take 2.5 mg by mouth every morning.    ASPIRIN 81 PO Take by mouth.   cholecalciferol (VITAMIN D) 1000 UNITS tablet Take 1,000 Units by mouth daily.   fexofenadine (ALLEGRA) 180 MG tablet Take 180 mg by mouth daily.   fluticasone  furoate-vilanterol (BREO ELLIPTA) 200-25 MCG/INH AEPB Inhale 1 puff into the lungs daily.   levothyroxine (SYNTHROID, LEVOTHROID) 100 MCG tablet Take 100 mcg by mouth daily before breakfast.    metoprolol tartrate (LOPRESSOR) 25 MG tablet Take 1 tablet (25 mg total) by mouth 2 (two) times daily.   Multiple Vitamin (MULTIVITAMIN PO) Take by mouth.   RESTASIS 0.05 % ophthalmic emulsion Place 1 drop into both eyes daily.    rosuvastatin (CRESTOR) 10 MG tablet Take 1 tablet (10 mg total) by mouth daily.   metoprolol tartrate (LOPRESSOR) 100 MG tablet Take 1 tablet (100 mg total) by mouth once for 1 dose. 2 hours before ct   No facility-administered encounter medications on file as of 01/06/2020.  :  Review of Systems:  Out of a complete 14 point review of systems, all are reviewed and negative with the exception of these symptoms as listed below: Review of Systems  Neurological:       Here for initial f/u on auto pap. Lorraine Buckley reports Lorraine Buckley is still trying to get adjusted to the machine.    Objective:  Neurological Exam  Physical Exam Physical Examination:   Vitals:   01/06/20 1512  BP: 126/80    General Examination: The patient is a very pleasant 74 y.o. female in no acute distress. Lorraine Buckley appears well-developed and well-nourished and well groomed.   HEENT: Normocephalic, atraumatic, pupils are equal, round and reactive to light, tracking is good without limitation to gaze excursion or nystagmus noted. Normal smooth pursuit is noted. Hearing is grossly intact. Face is symmetric with normal facial animation and normal facial sensation. Speech is clear with no dysarthria noted. There is no hypophonia. There is no lip, neck/head, jaw or voice tremor. Neck is supple with full range of passive and active motion. There are no carotid bruits on auscultation. Oropharynx exam reveals: mild mouth dryness, adequate dental hygiene and moderate airway crowding.  Tongue protrudes centrally in  palate  elevates symmetrically.  Chest: Clear to auscultation without wheezing, rhonchi or crackles noted.  Heart: S1+S2+0, regular.   Abdomen: Soft, non-tender and non-distended with normal bowel sounds appreciated on auscultation.  Extremities: There is no pitting edema in the distal lower extremities bilaterally.   Skin: Warm and dry without trophic changes noted.  Musculoskeletal: exam reveals no obvious joint deformities, tenderness or joint swelling or erythema.   Neurologically:  Mental status: The patient is awake, alert and oriented in all 4 spheres. Lorraine Buckley immediate and remote memory, attention, language skills and fund of knowledge are appropriate. There is no evidence of aphasia, agnosia, apraxia or anomia. Speech is clear with normal prosody and enunciation. Thought process is linear. Mood is normal and affect is normal.  Cranial nerves II - XII are as described above under HEENT exam. In addition: shoulder shrug is normal with equal shoulder height noted. Motor exam: Normal bulk, strength and tone is noted. There is no tremor. Romberg is negative. Fine motor skills and coordination: grossly intact.  Cerebellar testing: No dysmetria or intention tremor. There is no truncal or gait ataxia.  Sensory exam: intact to light touch.  Gait, station and balance: Lorraine Buckley stands easily. No veering to one side is noted. No leaning to one side is noted. Posture is age-appropriate and stance is narrow based. Gait shows normal stride length and normal pace. No problems turning are noted. Tandem walk is unremarkable.           Assessment and Plan:   In summary, YAHIRA TIMBERMAN is a very pleasant 74 year old female with an underlying medical history of asthma, hyperlipidemia, thyroid disease with history of multinodular goiter, vitamin D deficiency, allergic rhinitis, hypertension, vitamin D deficiency, obesity, and impaired fasting glucose, who presents for follow-up consultation of Lorraine Buckley episode of  transient lapse in memory, and amnesia.  Lorraine Buckley did have a headache at the time and differential diagnosis includes migraine headache, transient global amnesia.  Work-up has included a brain MRI and a recent EEG and a sleep study.  Lorraine Buckley sleep study showed overall rather mild obstructive sleep apnea, much more pronounced sleep apnea during REM sleep.  Lorraine Buckley has been diagnosed with PVCs.  Lorraine Buckley has been started on metoprolol low-dose per cardiology.  Lorraine Buckley has had no further episodes.  Lorraine Buckley is on AutoPap therapy and compliant with treatment.  Lorraine Buckley is commended for Lorraine Buckley treatment adherence.  Lorraine Buckley has not quite had any telltale results from it but is motivated to continue with treatment.  Lorraine Buckley is encouraged to continue to hydrate well with water, pursue healthy lifestyle, continue with Lorraine Buckley AutoPap therapy.  Lorraine Buckley has a follow-up appointment pending with Lorraine Buckley cardiologist soon.  Lorraine Buckley is advised to follow-up in our clinic for sleep apnea management in about 6 months, Lorraine Buckley can have a virtual visit with one of our nurse practitioners if Lorraine Buckley prefers.  Lorraine Buckley is advised to call us with any interim questions or concerns.  Answered all Lorraine Buckley questions today and Lorraine Buckley was in agreement with the plan.  I spent 30 minutes in total face-to-face time and in reviewing records during pre-charting, more than 50% of which was spent in counseling and coordination of care, reviewing test results, reviewing medications and treatment regimen and/or in discussing or reviewing the diagnosis of OSA, transient alteration in cognition, the prognosis and treatment options. Pertinent laboratory and imaging test results that were available during this visit with the patient were reviewed by me and considered in my medical decision making (see  chart for details).

## 2020-01-15 DIAGNOSIS — G4733 Obstructive sleep apnea (adult) (pediatric): Secondary | ICD-10-CM | POA: Diagnosis not present

## 2020-01-17 ENCOUNTER — Other Ambulatory Visit: Payer: Self-pay

## 2020-01-17 ENCOUNTER — Encounter: Payer: Self-pay | Admitting: Cardiology

## 2020-01-17 ENCOUNTER — Ambulatory Visit: Payer: PPO | Admitting: Cardiology

## 2020-01-17 VITALS — BP 110/70 | HR 70 | Resp 97 | Ht 67.0 in | Wt 241.0 lb

## 2020-01-17 DIAGNOSIS — E785 Hyperlipidemia, unspecified: Secondary | ICD-10-CM

## 2020-01-17 DIAGNOSIS — I420 Dilated cardiomyopathy: Secondary | ICD-10-CM

## 2020-01-17 DIAGNOSIS — R404 Transient alteration of awareness: Secondary | ICD-10-CM

## 2020-01-17 DIAGNOSIS — I2510 Atherosclerotic heart disease of native coronary artery without angina pectoris: Secondary | ICD-10-CM

## 2020-01-17 DIAGNOSIS — R0789 Other chest pain: Secondary | ICD-10-CM | POA: Diagnosis not present

## 2020-01-17 HISTORY — DX: Dilated cardiomyopathy: I42.0

## 2020-01-17 MED ORDER — LOSARTAN POTASSIUM 25 MG PO TABS
25.0000 mg | ORAL_TABLET | Freq: Every day | ORAL | 1 refills | Status: DC
Start: 2020-01-17 — End: 2020-07-03

## 2020-01-17 MED ORDER — METOPROLOL SUCCINATE ER 50 MG PO TB24: 50.0000 mg | ORAL_TABLET | Freq: Every day | ORAL | 1 refills | Status: DC

## 2020-01-17 NOTE — Progress Notes (Signed)
Cardiology Office Note:    Date:  01/17/2020   ID:  Jahira, [place] 1945-10-17, MRN [id number]  PCP:  Hulan Fess, MD  Cardiologist:  Jenne Campus, MD    Referring MD: Hulan Fess, MD   Chief Complaint  Patient presents with  . Follow-up  I am doing fine  History of Present Illness:    HARRYETTE SHUART is a 74 y.o. female with past medical history significant for altered mental status, so far work-up is negative. Also cardiomyopathy with ejection fraction 45 to 50%, recent coronary CT angio revealed only 0 to 24% stenosis of the left anterior descending artery, so excluding nonischemic cardiomyopathy. She comes today to my office to discuss those issues. Overall she is doing well. Denies have any chest pain tightness squeezing pressure burning chest, she does not have any more episodes of this alternate awareness.  Past Medical History:  Diagnosis Date  . Acute memory impairment   . Arthritis    FINGERS AND NECK HURT  . Asthma    ALLERGY INDUCED ASTHMA  . Gallstones    ABD PAIN, BAD GAS. FREQ STOOLS  . Heart murmur    PT NOT SURE IF MURMUR STILL HEARD - DOES NOT CAUSE ANY SYMPTOMS  . Hyperlipidemia   . Hypertension   . Hypothyroidism   . Pneumonia    ABOUT FEB 2013  . PONV (postoperative nausea and vomiting)   . Shortness of breath dyspnea    OCCAS SOB  . Thyroid disease   . Vitamin D deficiency     Past Surgical History:  Procedure Laterality Date  . ABDOMINAL HYSTERECTOMY    . BUNIONECTOMY     RIGHT BIG TOE  . CHOLECYSTECTOMY N/A 03/25/2014   Procedure: LAPAROSCOPIC CHOLECYSTECTOMY WITH INTRAOPERATIVE CHOLANGIOGRAM;  Surgeon: Armandina Gemma, MD;  Location: [facility];  Service: General;  Laterality: N/A;  . EYE SURGERY     LASIK EYE SURGERY  . TUBAL LIGATION      Current Medications: Current Meds  Medication Sig  . albuterol (PROVENTIL HFA;VENTOLIN HFA) 108 (90 BASE) MCG/ACT inhaler Inhale 2 puffs into the lungs every 4 (four) hours as needed. For  breathing  . amLODipine (NORVASC) 2.5 MG tablet Take 2.5 mg by mouth every morning.   . ASPIRIN 81 PO Take by mouth.  . cholecalciferol (VITAMIN D) 1000 UNITS tablet Take 1,000 Units by mouth daily.  . fexofenadine (ALLEGRA) 180 MG tablet Take 180 mg by mouth daily.  . fluticasone furoate-vilanterol (BREO ELLIPTA) 200-25 MCG/INH AEPB Inhale 1 puff into the lungs daily.  [name] levothyroxine (SYNTHROID, LEVOTHROID) 100 MCG tablet Take 100 mcg by mouth daily before breakfast.   . Multiple Vitamin (MULTIVITAMIN PO) Take by mouth.  . RESTASIS 0.05 % ophthalmic emulsion Place 1 drop into both eyes daily.   . rosuvastatin (CRESTOR) 10 MG tablet Take 1 tablet (10 mg total) by mouth daily.  . [DISCONTINUED] metoprolol tartrate (LOPRESSOR) 25 MG tablet Take 1 tablet (25 mg total) by mouth 2 (two) times daily.     Allergies:   Penicillins   Social History   Socioeconomic History  . Marital status: Married    Spouse name: Not on file  . Number of children: Not on file  . Years of education: Not on file  . Highest education level: Not on file  Occupational History  . Not on file  Tobacco Use  . Smoking status: Never Smoker  . Smokeless tobacco: Never Used  Substance and Sexual Activity  . Alcohol  use: No  . Drug use: No  . Sexual activity: Not Currently  Other Topics Concern  . Not on file  Social History Narrative  . Not on file   Social Determinants of Health   Financial Resource Strain:   . Difficulty of Paying Living Expenses: Not on file  Food Insecurity:   . Worried About Charity fundraiser in the Last Year: Not on file  . Ran Out of Food in the Last Year: Not on file  Transportation Needs:   . Lack of Transportation (Medical): Not on file  . Lack of Transportation (Non-Medical): Not on file  Physical Activity:   . Days of Exercise per Week: Not on file  . Minutes of Exercise per Session: Not on file  Stress:   . Feeling of Stress : Not on file  Social Connections:   .  Frequency of Communication with Friends and Family: Not on file  . Frequency of Social Gatherings with Friends and Family: Not on file  . Attends Religious Services: Not on file  . Active Member of Clubs or Organizations: Not on file  . Attends Archivist Meetings: Not on file  . Marital Status: Not on file     Family History: The patient's family history includes Asthma in her mother; Breast cancer in her maternal grandmother; Emphysema in her maternal grandfather; Heart disease in her maternal grandfather; Hypertension in her mother; Irritable bowel syndrome in her brother, mother, and sister. ROS:   Please see the history of present illness.    All 14 point review of systems negative except as described per history of present illness  EKGs/Labs/Other Studies Reviewed:      Recent Labs: 11/24/2019: BUN 17; Creatinine, Ser 0.80; Potassium 4.4; Sodium 140  Recent Lipid Panel No results found for: CHOL, TRIG, HDL, CHOLHDL, VLDL, LDLCALC, LDLDIRECT  Physical Exam:    VS:  BP 110/70 (BP Location: Right Arm, Patient Position: Sitting, Cuff Size: Normal)   Pulse 70   Resp (!) 97   Ht 5\' 7"  (1.702 m)   Wt 241 lb (109.3 kg)   BMI 37.75 kg/m     Wt Readings from Last 3 Encounters:  01/17/20 241 lb (109.3 kg)  01/06/20 239 lb 5 oz (108.6 kg)  11/24/19 240 lb (108.9 kg)     GEN:  Well nourished, well developed in no acute distress HEENT: Normal NECK: No JVD; No carotid bruits LYMPHATICS: No lymphadenopathy CARDIAC: RRR, no murmurs, no rubs, no gallops RESPIRATORY:  Clear to auscultation without rales, wheezing or rhonchi  ABDOMEN: Soft, non-tender, non-distended MUSCULOSKELETAL:  No edema; No deformity  SKIN: Warm and dry LOWER EXTREMITIES: no swelling NEUROLOGIC:  Alert and oriented x 3 PSYCHIATRIC:  Normal affect   ASSESSMENT:    1. Atypical chest pain   2. Dyslipidemia   3. Transient alteration of awareness   4. Dilated cardiomyopathy (HCC) ejection fraction  45 to 50%   5. Coronary artery disease involving native coronary artery of native heart without angina pectoris    PLAN:    In order of problems listed above:  1. Atypical chest pain her coronary CT did not show any significant abnormality. We will continue conservative approach. 2. Dyslipidemia she is on 10 mg of Crestor which I will continue. I do have her last K PN from January 2021 showing LDL of 117 HDL 49, we will try to request copy of more recent cholesterol profile. 3. History of altered mental status. Not anymore. So  far work-up negative. 4. Dilated cardiomyopathy: I will switch her from short acting form of metoprolol to long-acting form of Toprol, I also switch her from amlodipine to losartan. I will restart her on Entresto, however she does not want to take any medication twice daily. Therefore, we will go with losartan. 5. Coronary artery disease again the only minimal abnormality based on coronary CT angio. Conservative approach.   Medication Adjustments/Labs and Tests Ordered: Current medicines are reviewed at length with the patient today.  Concerns regarding medicines are outlined above.  No orders of the defined types were placed in this encounter.  Medication changes: No orders of the defined types were placed in this encounter.   Signed, Park Liter, MD, [facility] 01/17/2020 3:52 PM    [place]

## 2020-01-17 NOTE — Patient Instructions (Signed)
Medication Instructions:  Your physician has recommended you make the following change in your medication:   STOP: Metoprolol tartrate   START: Metoprolol succinate 50 mg daily    STOP: Amlodipine   START: Losartan 25 mg daily   *If you need a refill on your cardiac medications before your next appointment, please call your pharmacy*   Lab Work: Your physician recommends that you return for lab work in 1 week: bmp   If you have labs (blood work) drawn today and your tests are completely normal, you will receive your results only by:  [place] (if you have MyChart) OR  A paper copy in the mail If you have any lab test that is abnormal or we need to change your treatment, we will call you to review the results.   Testing/Procedures: None.    Follow-Up: At [facility], you and your health needs are our priority.  As part of our continuing mission to provide you with exceptional heart care, we have created designated Provider Care Teams.  These Care Teams include your primary Cardiologist (physician) and Advanced Practice Providers (APPs -  Physician Assistants and Nurse Practitioners) who all work together to provide you with the care you need, when you need it.  We recommend signing up for the patient portal called "MyChart".  Sign up information is provided on this After Visit Summary.  MyChart is used to connect with patients for Virtual Visits (Telemedicine).  Patients are able to view lab/test results, encounter notes, upcoming appointments, etc.  Non-urgent messages can be sent to your provider as well.   To learn more about what you can do with MyChart, go to [email or web address].    Your next appointment:   5 month(s)  The format for your next appointment:   In Person  Provider:   Jenne Campus, MD   Other Instructions  Losartan Tablets What is this medicine? LOSARTAN (loe SAR tan) is an angiotensin II receptor blocker, also known as an ARB. It  treats high blood pressure. It can slow kidney damage in some patients. It may also be used to lower the risk of stroke. This medicine may be used for other purposes; ask your health care provider or pharmacist if you have questions. COMMON BRAND NAME(S): Cozaar What should I tell my health care provider before I take this medicine? They need to know if you have any of these conditions:  heart failure  kidney or liver disease  an unusual or allergic reaction to losartan, other medicines, foods, dyes, or preservatives  pregnant or trying to get pregnant  breast-feeding How should I use this medicine? Take this drug by mouth. Take it as directed on the prescription label at the same time every day. You can take it with or without food. If it upsets your stomach, take it with food. Keep taking it unless your health care provider tells you to stop. Talk to your health care provider about the use of this drug in children. While it may be prescribed for children as young as 6 for selected conditions, precautions do apply. Overdosage: If you think you have taken too much of this medicine contact a poison control center or emergency room at once. NOTE: This medicine is only for you. Do not share this medicine with others. What if I miss a dose? If you miss a dose, take it as soon as you can. If it is almost time for your next dose, take only that dose. Do not take  double or extra doses. What may interact with this medicine?  blood pressure medicines  diuretics, especially triamterene, spironolactone, or amiloride  fluconazole  NSAIDs, medicines for pain and inflammation, like ibuprofen or naproxen  potassium salts or potassium supplements  rifampin This list may not describe all possible interactions. Give your health care provider a list of all the medicines, herbs, non-prescription drugs, or dietary supplements you use. Also tell them if you smoke, drink alcohol, or use illegal drugs.  Some items may interact with your medicine. What should I watch for while using this medicine? Visit your doctor or health care professional for regular checks on your progress. Check your blood pressure as directed. Ask your doctor or health care professional what your blood pressure should be and when you should contact him or her. Call your doctor or health care professional if you notice an irregular or fast heart beat. Women should inform their doctor if they wish to become pregnant or think they might be pregnant. There is a potential for serious side effects to an unborn child, particularly in the second or third trimester. Talk to your health care professional or pharmacist for more information. You may get drowsy or dizzy. Do not drive, use machinery, or do anything that needs mental alertness until you know how this drug affects you. Do not stand or sit up quickly, especially if you are an older patient. This reduces the risk of dizzy or fainting spells. Alcohol can make you more drowsy and dizzy. Avoid alcoholic drinks. Avoid salt substitutes unless you are told otherwise by your doctor or health care professional. Do not treat yourself for coughs, colds, or pain while you are taking this medicine without asking your doctor or health care professional for advice. Some ingredients may increase your blood pressure. What side effects may I notice from receiving this medicine? Side effects that you should report to your doctor or health care professional as soon as possible:  confusion, dizziness, light headedness or fainting spells  decreased amount of urine passed  difficulty breathing or swallowing, hoarseness, or tightening of the throat  fast or irregular heart beat, palpitations, or chest pain  skin rash, itching  swelling of your face, lips, tongue, hands, or feet Side effects that usually do not require medical attention (report to your doctor or health care professional if they  continue or are bothersome):  cough  decreased sexual function or desire  headache  nasal congestion or stuffiness  nausea or stomach pain  sore or cramping muscles This list may not describe all possible side effects. Call your doctor for medical advice about side effects. You may report side effects to FDA at 1-800-FDA-1088. Where should I keep my medicine? Keep out of the reach of children and pets. Store at room temperature between 15 and 30 degrees C (59 and 86 degrees F). Protect from light. Keep the container tightly closed. Throw away any unused drug after the expiration date. NOTE: This sheet is a summary. It may not cover all possible information. If you have questions about this medicine, talk to your doctor, pharmacist, or health care provider.  2020 Elsevier/Gold Standard (2018-12-09 12:12:28)  Metoprolol Extended-Release Tablets Qu es este medicamento? El METOPROLOL es un betabloqueante. Los betabloqueantes reducen la carga de [place] y lo ayudan a latir ms regularmente. Este medicamento se [place] para tratar la alta presin sangunea y para Theatre manager. Adems, puede ser til despus de un ataque cardiaco o para prevenir  un ataque cardiaco adicional. Este medicamento puede ser utilizado para otros usos; si tiene alguna pregunta consulte con su proveedor de atencin mdica o con su farmacutico. MARCAS COMUNES: toprol, Toprol XL Qu le debo informar a mi profesional de la salud antes de tomar este medicamento? Necesita saber si usted presenta alguno de los [employer] o situaciones:  diabetes  enfermedades cardacas o vasculares, tales como frecuencia cardiaca lenta, empeoramiento de insuficiencia cardiaca, bloqueo cardiaco, sndrome del seno enfermo o enfermedad de Raynaud  enfermedad renal  enfermedad heptica  enfermedad pulmonar o respiratoria, como asma o enfisema  feocromocitoma  enfermedad tiroidea  una reaccin  alrgica o inusual al metoprolol, a otros betabloqueantes, medicamentos, alimentos, colorantes o conservantes  si est embarazada o buscando quedar embarazada  si est amamantando a un beb Cmo debo utilizar este medicamento? Tome este medicamento por va oral con un vaso de agua. Siga las instrucciones de la etiqueta del [place]. No lo triture ni mastique. Tome este medicamento con o inmediatamente despus de las comidas. Tome sus dosis a intervalos regulares. No tome su medicamento con una frecuencia mayor a la indicada. No suspenda este medicamento de [employer]. Puede causarle serios problemas cardiacos. Hable con su pediatra para informarse acerca del uso de este medicamento en nios. Aunque este medicamento ha sido recetado a nios tan menores como de 6 aos de edad para condiciones selectivas, las precauciones se aplican. Sobredosis: Pngase en contacto inmediatamente con un centro toxicolgico o una sala de urgencia si usted cree que haya tomado demasiado medicamento. ATENCIN: [employer] es solo para usted. No comparta este medicamento con nadie. Qu sucede si me olvido de una dosis? Si olvida una dosis, tmela lo antes posible. Si es casi la hora de la prxima dosis, tome slo esa dosis. No tome dosis adicionales o dobles. Qu puede interactuar con este medicamento? Esta medicina puede interactuar con los siguientes medicamentos:  ciertos medicamentos para la presin sangunea, enfermedad cardiaca, pulso cardiaco irregular  ciertos medicamentos para la depresin, tales como inhibidores de la monoamina oxidasa (IMAO), fluoxetina o paroxetina  clonidina  dobutamina  epinefrina  isoproterenol  reserpina Puede ser que esta lista no menciona todas las posibles interacciones. Informe a su profesional de [employer]	[place] de [employer] productos a base de hierbas, medicamentos de Casa o suplementos nutritivos que est tomando. Si usted fuma, consume bebidas alcohlicas o  si utiliza drogas ilegales, indqueselo tambin a su profesional de [employer]	[place]. Algunas sustancias pueden interactuar con su medicamento. A qu debo estar atento al usar [employer]? Visite a su mdico o a su profesional de la salud para que revise su evolucin peridicamente. Contacte a su mdico de inmediato si sus sntomas empeoran. Revise su presin sangunea y su pulso regularmente. Pregunte a su profesional de la salud cules deben ser su presin sangunea y su pulso, y cundo Research scientist (life sciences). Puede experimentar somnolencia o mareos. No conduzca, no utilice maquinaria ni haga nada que Associate Professor en estado de alerta hasta que sepa cmo le afecta este medicamento. No se siente ni se ponga de pie con rapidez, especialmente si es un paciente de edad avanzada. Esto reduce el riesgo de mareos o [employer]. Contacte a su mdico si estos sntomas continan. El alcohol puede interferir con el efecto de este medicamento. Evite consumir bebidas alcohlicas. Este medicamento puede aumentar los niveles de Dispensing optician. Pregntele a su profesional de la salud si es Chartered loss adjuster cambios en la dieta o en los medicamentos si  usted tiene diabetes. Qu efectos secundarios puedo tener al [employer] este medicamento? Efectos secundarios que debe informar a su mdico o a Barrister's clerk de la salud tan pronto como sea posible: Chief of Staff, como erupcin cutnea, comezn/picazn o urticaria manos o pies fros o entumecidos depresin dificultad al respirar [employer] fiebre con dolor de garganta pulso cardiaco irregular, dolor en el pecho aumento rpido de peso signos y sntomas de niveles altos de Location manager en la sangre, tales como tener ms sed o apetito, o tener que orinar con mayor frecuencia que lo habitual. Tambin puede sentirse muy cansado o tener visin borrosa. piernas o tobillos hinchados Efectos secundarios que generalmente no requieren atencin mdica (infrmelos a su mdico o a su  profesional de la salud si persisten o si son molestos): ansiedad o nerviosismo cambios en el deseo o desempeo sexual piel seca dolor de cabeza pesadillas o problemas para dormir prdida de memoria a Sport and exercise psychologist o diarrea Puede ser que esta lista no menciona todos los posibles efectos secundarios. Comunquese a su mdico por asesoramiento mdico [employer]. Usted puede informar los efectos secundarios a la FDA por telfono al 1-800-FDA-1088. Dnde debo guardar mi medicina? Mantngala fuera del alcance de los nios. Gurdela a [employer], entre 15 y 80 grados C (31 y 84 grados F). Deseche todo el medicamento que no haya utilizado, despus de la fecha de vencimiento. ATENCIN: Este folleto es un resumen. Puede ser que no cubra toda la posible informacin. Si usted tiene preguntas acerca de esta medicina, consulte con su mdico, su farmacutico o su profesional de Technical sales engineer.  2020 Elsevier/Gold Standard (2018-07-07 00:00:00)

## 2020-02-14 DIAGNOSIS — I2510 Atherosclerotic heart disease of native coronary artery without angina pectoris: Secondary | ICD-10-CM | POA: Diagnosis not present

## 2020-02-14 DIAGNOSIS — R0789 Other chest pain: Secondary | ICD-10-CM | POA: Diagnosis not present

## 2020-02-14 DIAGNOSIS — G4733 Obstructive sleep apnea (adult) (pediatric): Secondary | ICD-10-CM | POA: Diagnosis not present

## 2020-02-14 DIAGNOSIS — I420 Dilated cardiomyopathy: Secondary | ICD-10-CM | POA: Diagnosis not present

## 2020-02-15 LAB — BASIC METABOLIC PANEL
BUN/Creatinine Ratio: 14 (ref 12–28)
BUN: 12 mg/dL (ref 8–27)
CO2: 24 mmol/L (ref 20–29)
Calcium: 9.5 mg/dL (ref 8.7–10.3)
Chloride: 104 mmol/L (ref 96–106)
Creatinine, Ser: 0.85 mg/dL (ref 0.57–1.00)
GFR calc Af Amer: 79 mL/min/{1.73_m2} (ref >59)
GFR calc non Af Amer: 68 mL/min/{1.73_m2} (ref >59)
Glucose: 123 mg/dL — ABNORMAL HIGH (ref 65–99)
Potassium: 4.4 mmol/L (ref 3.5–5.2)
Sodium: 141 mmol/L (ref 134–144)

## 2020-06-26 ENCOUNTER — Ambulatory Visit: Payer: PPO | Admitting: Cardiology

## 2020-06-26 NOTE — Progress Notes (Addendum)
PATIENT: Lorraine Buckley DOB: [birth year]  REASON FOR VISIT: follow up HISTORY FROM: patient  Virtual Visit via Telephone Note  I connected with Lorraine Buckley on 06/27/20 at  8:00 AM EST by telephone and verified that I am speaking with the correct person using two identifiers.   I discussed the limitations, risks, security and privacy concerns of performing an evaluation and management service by telephone and the availability of in person appointments. I also discussed with the patient that there may be a patient responsible charge related to this service. The patient expressed understanding and agreed to proceed.   History of Present Illness:  06/27/20 Lorraine Buckley is a 75 y.o. female here today for follow up for OSA and altered awareness episode that occurred in March, 2021.  She reports that she did not see any significant improvement in how she felt with using CPAP therapy.  Compliance was low and CPAP was returned.  She has not had any reoccurring events of altered awareness.  She continues to follow-up closely with primary care and cardiology.  She had noted some fluctuating blood pressures several weeks ago but feels that that has leveled out, recently.  She is feeling well today and without concerns.  Compliance report dated 02/14/2020 through 05/13/2020 reveals that she use CPAP 14 of 90 days for compliance of 16%.  She used CPAP greater than 4 hours 11 of 90 days for compliance of 12%.  Residual AHI was 0.6 on 5 to 11 cm of water and an EPR of 3.  There was no significant leak noted.   History (copied from Dr Guadelupe Sabin previous note)  Lorraine Buckley is a 75 year old right-handed woman with an underlying medical history of asthma, hyperlipidemia, thyroid disease with history of multinodular goiter, vitamin D deficiency, allergic rhinitis, hypertension, vitamin D deficiency, obesity, and impaired fasting glucose, who Presents for follow-up consultation of her episode of confusion  which happened in March including a lapse in her memory and amnesia.  She also follows up for her recent diagnosis of obstructive sleep apnea after her baseline sleep study from May 2021.  Patient is unaccompanied today.  I first met her on 09/07/2019 at the request of her primary care physician, at which time she reported an episode in mid March of memory lapse and amnesia, confusion lasting about 15 minutes.  She had a nonfocal neurological exam but she did report some sleep difficulties.  She was advised to proceed with further testing in the form of EEG, carotid Doppler ultrasound and sleep study.  Her EEG in May was found to be normal, she was advised of the test results.  She did not have her carotid Doppler ultrasound done.  Her sleep study from 10/03/2019 showed overall mild obstructive sleep apnea but poor sleep efficiency and reduced REM sleep.  Her AHI was 5.5/h, REM AHI was 46.2/h and O2 nadir 83%.  Based on her symptoms, and her medical history she was advised to proceed with treatment in the form of AutoPap therapy.  Today, 01/06/2020: I reviewed her AutoPap compliance data from 12/06/2019 through 01/04/2020, which is a total of 30 days, during which time she used her machine 29 days with percent use days greater than 4 hours at 87%, indicating very good compliance with an average usage of 6 hours and 11 minutes, residual AHI at goal at 0.8/h, 95th percentile of pressure at 9.7 cm with a range of 5 to 11 cm with EPR.  Leak acceptable with a  95th percentile at 8.7 L/min.  She reports feeling stable.  She has not had any new neurological symptoms or episodes of memory impairment.  She has had palpitations.  She saw cardiology.  She has been placed on low-dose metoprolol.  She reports that she is still adjusting to her AutoPap machine.  She is motivated to continue with treatment.  She is not quite sure if it has helped her.  The patient's allergies, current medications, family history, past medical  history, past social history, past surgical history and problem list were reviewed and updated as appropriate.    Observations/Objective:  Generalized: Well developed, in no acute distress  Mentation: Alert oriented to time, place, history taking. Follows all commands speech and language fluent   Assessment and Plan:  75 y.o. year old female  has a past medical history of Acute memory impairment, Arthritis, Asthma, Gallstones, Heart murmur, Hyperlipidemia, Hypertension, Hypothyroidism, Pneumonia, PONV (postoperative nausea and vomiting), Shortness of breath dyspnea, Thyroid disease, and Vitamin D deficiency. here with    ICD-10-CM   1. OSA on CPAP  G47.33    Z99.89   2. Episode of altered cognition  R41.89     Lorraine Buckley did not notice any significant improvement with using CPAP therapy.  Review of 90-day compliance report dated 02/14/2020 through 05/13/2020 reveals that she was 16% compliant from daily usage and 12% compliant with 4-hour usage.  She reports that machine has been returned to DME.  Fortunately, she has not had any additional events of altered awareness.  She was encouraged to follow-up closely with her care team.  Healthy lifestyle habits encouraged.  No follow-up needed at this time.   No orders of the defined types were placed in this encounter.   No orders of the defined types were placed in this encounter.    Follow Up Instructions:  I discussed the assessment and treatment plan with the patient. The patient was provided an opportunity to ask questions and all were answered. The patient agreed with the plan and demonstrated an understanding of the instructions.   The patient was advised to call back or seek an in-person evaluation if the symptoms worsen or if the condition fails to improve as anticipated.  I provided 15 minutes of non-face-to-face time during this encounter. Patient is located at her place of residence during [place] visit. Provider is in the office.     Debbora Presto, NP   I reviewed the above note and documentation by the Nurse Practitioner and agree with the history, exam, assessment and plan as outlined above. I was available for consultation. Star Age, MD, PhD [facility])

## 2020-06-27 ENCOUNTER — Encounter: Payer: Self-pay | Admitting: Family Medicine

## 2020-06-27 ENCOUNTER — Telehealth (INDEPENDENT_AMBULATORY_CARE_PROVIDER_SITE_OTHER): Payer: PPO | Admitting: Family Medicine

## 2020-06-27 DIAGNOSIS — G4733 Obstructive sleep apnea (adult) (pediatric): Secondary | ICD-10-CM

## 2020-06-27 DIAGNOSIS — R4189 Other symptoms and signs involving cognitive functions and awareness: Secondary | ICD-10-CM | POA: Diagnosis not present

## 2020-06-27 DIAGNOSIS — Z9989 Dependence on other enabling machines and devices: Secondary | ICD-10-CM

## 2020-06-27 NOTE — Patient Instructions (Signed)
Continue close follow up with care team.   Call if you need [place].   Sleep Apnea Sleep apnea affects breathing during sleep. It causes breathing to stop for a short time or to become shallow. It can also increase the risk of:  Heart attack.  Stroke.  Being very overweight (obese).  Diabetes.  Heart failure.  Irregular heartbeat. The goal of treatment is to help you breathe normally again. What are the causes? There are three kinds of sleep apnea:  Obstructive sleep apnea. This is caused by a blocked or collapsed airway.  Central sleep apnea. This happens when the brain does not send the right signals to the muscles that control breathing.  Mixed sleep apnea. This is a combination of obstructive and central sleep apnea. The most common cause of this condition is a collapsed or blocked airway. This can happen if:  Your throat muscles are too relaxed.  Your tongue and tonsils are too large.  You are overweight.  Your airway is too small.   What increases the risk?  Being overweight.  Smoking.  Having a small airway.  Being older.  Being female.  Drinking alcohol.  Taking medicines to calm yourself (sedatives or tranquilizers).  Having family members with the condition. What are the signs or symptoms?  Trouble staying asleep.  Being sleepy or tired during the day.  Getting angry a lot.  Loud snoring.  Headaches in the morning.  Not being able to focus your mind (concentrate).  Forgetting things.  Less interest in sex.  Mood swings.  Personality changes.  Feelings of sadness (depression).  Waking up a lot during the night to pee (urinate).  Dry mouth.  Sore throat. How is this diagnosed?  Your medical history.  A physical exam.  A test that is done when you are sleeping (sleep study). The test is most often done in a sleep lab but may also be done at home. How is this treated?  Sleeping on your side.  Using a medicine to get rid of  mucus in your nose (decongestant).  Avoiding the use of alcohol, medicines to help you relax, or certain pain medicines (narcotics).  Losing weight, if needed.  Changing your diet.  Not smoking.  Using a machine to open your airway while you sleep, such as: ? An oral appliance. This is a mouthpiece that shifts your lower jaw forward. ? A CPAP device. This device blows air through a mask when you breathe out (exhale). ? An EPAP device. This has valves that you put in each nostril. ? A BPAP device. This device blows air through a mask when you breathe in (inhale) and breathe out.  Having surgery if other treatments do not work. It is important to get treatment for sleep apnea. Without treatment, it can lead to:  High blood pressure.  Coronary artery disease.  In men, not being able to have an erection (impotence).  Reduced thinking ability.   Follow these instructions at home: Lifestyle  Make changes that your doctor recommends.  Eat a healthy diet.  Lose weight if needed.  Avoid alcohol, medicines to help you relax, and some pain medicines.  Do not use any products that contain nicotine or tobacco, such as cigarettes, e-cigarettes, and chewing tobacco. If you need help quitting, ask your doctor. General instructions  Take over-the-counter and prescription medicines only as told by your doctor.  If you were given a machine to use while you sleep, use it only as told by  your doctor.  If you are having surgery, make sure to tell your doctor you have sleep apnea. You may need to bring your device with you.  Keep all follow-up visits as told by your doctor. This is important. Contact a doctor if:  The machine that you were given to use during sleep bothers you or does not seem to be working.  You do not get better.  You get worse. Get help right away if:  Your chest hurts.  You have trouble breathing in enough air.  You have an uncomfortable feeling in your back,  arms, or stomach.  You have trouble talking.  One side of your body feels weak.  A part of your face is hanging down. These symptoms may be an emergency. Do not wait to see if the symptoms will go away. Get medical help right away. Call your local emergency services (911 in the [place] Do not drive yourself to the hospital. Summary  This condition affects breathing during sleep.  The most common cause is a collapsed or blocked airway.  The goal of treatment is to help you breathe normally while you sleep. This information is not intended to replace advice given to you by your health care provider. Make sure you discuss any questions you have with your health care provider. Document Revised: 02/20/2018 Document Reviewed: 12/30/2017 Elsevier Patient Education  2021 Protection.   Stroke Prevention Some medical conditions and lifestyle choices can lead to a higher risk for a stroke. You can help to prevent a stroke by eating healthy foods and exercising. It also helps to not smoke and to manage any health problems you may have. How can this condition affect me? A stroke is an emergency. It should be treated right away. A stroke can lead to brain damage or threaten your life. There is a better chance of surviving and getting better after a stroke if you get medical help right away. What can increase my risk? The following medical conditions may increase your risk of a stroke:  Diseases of the heart and blood vessels (cardiovascular disease).  High blood pressure (hypertension).  Diabetes.  High cholesterol.  Sickle cell disease.  Problems with blood clotting.  Being very overweight.  Sleeping problems (obstructivesleep apnea). Other risk factors include:  Being older than age 34.  A history of blood clots, stroke, or mini-stroke (TIA).  Race, ethnic background, or a family history of stroke.  Smoking or using tobacco products.  Taking birth control pills, especially  if you smoke.  Heavy alcohol and drug use.  Not being active. What actions can I take to prevent this? Manage your health conditions  High cholesterol. ? Eat a healthy diet. If this is not enough to manage your cholesterol, you may need to take medicines. ? Take medicines as told by your doctor.  High blood pressure. ? Try to keep your blood pressure below 130/80. ? If your blood pressure cannot be managed through a healthy diet and regular exercise, you may need to take medicines. ? Take medicines as told by your doctor. ? Ask your doctor if you should check your blood pressure at home. ? Have your blood pressure checked every year.  Diabetes. ? Eat a healthy diet and get regular exercise. If your blood sugar (glucose) cannot be managed through diet and exercise, you may need to take medicines. ? Take medicines as told by your doctor.  Talk to your doctor about getting checked for sleeping problems. Signs of  a problem can include: ? Snoring a lot. ? Feeling very tired.  Make sure that you manage any other conditions you have. Nutrition  Follow instructions from your doctor about what to eat or drink. You may be told to: ? Eat and drink fewer calories each day. ? Limit how much salt (sodium) you use to 1,500 milligrams (mg) each day. ? Use only healthy fats for cooking, such as olive oil, canola oil, and sunflower oil. ? Eat healthy foods. To do this:  Choose foods that are high in fiber. These include whole grains, and fresh fruits and vegetables.  Eat at least 5 servings of fruits and vegetables a day. Try to fill one-half of your plate with fruits and vegetables at each meal.  Choose low-fat (lean) proteins. These include low-fat cuts of meat, chicken without skin, fish, tofu, beans, and nuts.  Eat low-fat dairy products. ? Avoid foods that:  Are high in salt.  Have saturated fat.  Have trans fat.  Have cholesterol.  Are processed or pre-made. ? Count how many  carbohydrates you eat and drink each day.   Lifestyle  If you drink alcohol: ? Limit how much you have to:  0-1 drink a day for women who are not pregnant.  0-2 drinks a day for men. ? Know how much alcohol is in your drink. In the [place] one drink equals one 12 oz bottle of beer (379mL), one 5 oz glass of wine (178mL), or one 1 oz glass of hard liquor (56mL).  Do not smoke or use any products that have nicotine or tobacco. If you need help quitting, ask your doctor.  Avoid secondhand smoke.  Do not use drugs. Activity  Try to stay at a healthy weight.  Get at least 30 minutes of exercise on most days, such as: ? Fast walking. ? Biking. ? Swimming.   Medicines  Take over-the-counter and prescription medicines only as told by your doctor.  Avoid taking birth control pills. Talk to your doctor about the risks of taking birth control pills if: ? You are over 53 years old. ? You smoke. ? You get very bad headaches. ? You have had a blood clot. Where to find more information  American Stroke Association: www.strokeassociation.org Get help right away if:  You or a loved one has any signs of a stroke. "BE FAST" is an easy way to remember the warning signs: ? B - Balance. Dizziness, sudden trouble walking, or loss of balance. ? E - Eyes. Trouble seeing or a change in how you see. ? F - Face. Sudden weakness or loss of feeling of the face. The face or eyelid may droop on one side. ? A - Arms. Weakness or loss of feeling in an arm. This happens all of a sudden and most often on one side of the body. ? S - Speech. Sudden trouble speaking, slurred speech, or trouble understanding what people say. ? T - Time. Time to call emergency services. Write down what time symptoms started.  You or a loved one has other signs of a stroke, such as: ? A sudden, very bad headache with no known cause. ? Feeling like you may vomit (nausea). ? Vomiting. ? A seizure. These symptoms may be an  emergency. Get help right away. Call your local emergency services (911 in the [place]  Do not wait to see if the symptoms will go away.  Do not drive yourself to the hospital. Summary  You can help  to prevent a stroke by eating healthy, exercising, and not smoking. It also helps to manage any health problems you have.  Do not smoke or use any products that contain nicotine or tobacco.  Get help right away if you or a loved one has any signs of a stroke. This information is not intended to replace advice given to you by your health care provider. Make sure you discuss any questions you have with your health care provider. Document Revised: 12/06/2019 Document Reviewed: 12/06/2019 Elsevier Patient Education  [place].

## 2020-07-03 ENCOUNTER — Other Ambulatory Visit: Payer: Self-pay | Admitting: Cardiology

## 2020-07-03 NOTE — Telephone Encounter (Signed)
Rx refill sent to pharmacy. 

## 2020-07-27 DIAGNOSIS — Z1389 Encounter for screening for other disorder: Secondary | ICD-10-CM | POA: Diagnosis not present

## 2020-07-27 DIAGNOSIS — Z0000 Encounter for general adult medical examination without abnormal findings: Secondary | ICD-10-CM | POA: Diagnosis not present

## 2020-07-28 ENCOUNTER — Other Ambulatory Visit: Payer: Self-pay | Admitting: Family Medicine

## 2020-07-28 DIAGNOSIS — J4530 Mild persistent asthma, uncomplicated: Secondary | ICD-10-CM | POA: Diagnosis not present

## 2020-07-28 DIAGNOSIS — Z0000 Encounter for general adult medical examination without abnormal findings: Secondary | ICD-10-CM | POA: Diagnosis not present

## 2020-07-28 DIAGNOSIS — Z1231 Encounter for screening mammogram for malignant neoplasm of breast: Secondary | ICD-10-CM

## 2020-07-28 DIAGNOSIS — I10 Essential (primary) hypertension: Secondary | ICD-10-CM | POA: Diagnosis not present

## 2020-07-28 DIAGNOSIS — E2839 Other primary ovarian failure: Secondary | ICD-10-CM

## 2020-07-28 DIAGNOSIS — E785 Hyperlipidemia, unspecified: Secondary | ICD-10-CM | POA: Diagnosis not present

## 2020-07-28 DIAGNOSIS — E039 Hypothyroidism, unspecified: Secondary | ICD-10-CM | POA: Diagnosis not present

## 2020-07-28 DIAGNOSIS — E559 Vitamin D deficiency, unspecified: Secondary | ICD-10-CM | POA: Diagnosis not present

## 2020-08-14 ENCOUNTER — Other Ambulatory Visit: Payer: Self-pay

## 2020-08-14 DIAGNOSIS — E2839 Other primary ovarian failure: Secondary | ICD-10-CM

## 2020-08-14 DIAGNOSIS — E559 Vitamin D deficiency, unspecified: Secondary | ICD-10-CM | POA: Insufficient documentation

## 2020-08-14 DIAGNOSIS — I10 Essential (primary) hypertension: Secondary | ICD-10-CM | POA: Insufficient documentation

## 2020-08-14 DIAGNOSIS — M1990 Unspecified osteoarthritis, unspecified site: Secondary | ICD-10-CM | POA: Insufficient documentation

## 2020-08-14 DIAGNOSIS — K8020 Calculus of gallbladder without cholecystitis without obstruction: Secondary | ICD-10-CM | POA: Insufficient documentation

## 2020-08-14 DIAGNOSIS — R413 Other amnesia: Secondary | ICD-10-CM | POA: Insufficient documentation

## 2020-08-14 DIAGNOSIS — Z9889 Other specified postprocedural states: Secondary | ICD-10-CM | POA: Insufficient documentation

## 2020-08-14 DIAGNOSIS — I499 Cardiac arrhythmia, unspecified: Secondary | ICD-10-CM

## 2020-08-14 DIAGNOSIS — J309 Allergic rhinitis, unspecified: Secondary | ICD-10-CM | POA: Insufficient documentation

## 2020-08-14 DIAGNOSIS — J189 Pneumonia, unspecified organism: Secondary | ICD-10-CM | POA: Insufficient documentation

## 2020-08-14 DIAGNOSIS — Z6835 Body mass index (BMI) 35.0-35.9, adult: Secondary | ICD-10-CM

## 2020-08-14 DIAGNOSIS — J4530 Mild persistent asthma, uncomplicated: Secondary | ICD-10-CM

## 2020-08-14 DIAGNOSIS — E041 Nontoxic single thyroid nodule: Secondary | ICD-10-CM | POA: Insufficient documentation

## 2020-08-14 DIAGNOSIS — R011 Cardiac murmur, unspecified: Secondary | ICD-10-CM | POA: Insufficient documentation

## 2020-08-14 DIAGNOSIS — R7301 Impaired fasting glucose: Secondary | ICD-10-CM

## 2020-08-14 DIAGNOSIS — E785 Hyperlipidemia, unspecified: Secondary | ICD-10-CM | POA: Insufficient documentation

## 2020-08-14 HISTORY — DX: Mild persistent asthma, uncomplicated: J45.30

## 2020-08-14 HISTORY — DX: Body mass index (BMI) 35.0-35.9, adult: Z68.35

## 2020-08-14 HISTORY — DX: Impaired fasting glucose: R73.01

## 2020-08-14 HISTORY — DX: Other primary ovarian failure: E28.39

## 2020-08-14 HISTORY — DX: Cardiac arrhythmia, unspecified: I49.9

## 2020-08-15 ENCOUNTER — Other Ambulatory Visit: Payer: Self-pay

## 2020-08-15 ENCOUNTER — Encounter: Payer: Self-pay | Admitting: Cardiology

## 2020-08-15 ENCOUNTER — Ambulatory Visit: Payer: PPO | Admitting: Cardiology

## 2020-08-15 VITALS — BP 124/74 | HR 64 | Ht 67.0 in | Wt 240.0 lb

## 2020-08-15 DIAGNOSIS — E785 Hyperlipidemia, unspecified: Secondary | ICD-10-CM

## 2020-08-15 DIAGNOSIS — I420 Dilated cardiomyopathy: Secondary | ICD-10-CM | POA: Diagnosis not present

## 2020-08-15 DIAGNOSIS — I10 Essential (primary) hypertension: Secondary | ICD-10-CM

## 2020-08-15 DIAGNOSIS — I2510 Atherosclerotic heart disease of native coronary artery without angina pectoris: Secondary | ICD-10-CM | POA: Diagnosis not present

## 2020-08-15 MED ORDER — PRAVASTATIN SODIUM 20 MG PO TABS: 20.0000 mg | ORAL_TABLET | Freq: Every evening | ORAL | 3 refills | Status: DC

## 2020-08-15 NOTE — Patient Instructions (Addendum)
Medication Instructions:  Your physician has recommended you make the following change in your medication:  START: Pravastatin 20 mg once daily *If you need a refill on your cardiac medications before your next appointment, please call your pharmacy*   Lab Work: Your physician recommends that you return for lab work: In the next 6 weeks: Lipids (fasting), LFTs  If you have labs (blood work) drawn today and your tests are completely normal, you will receive your results only by: [name] MyChart Message (if you have MyChart) OR . A paper copy in the mail If you have any lab test that is abnormal or we need to change your treatment, we will call you to review the results.   Testing/Procedures: Your physician has requested that you have an echocardiogram. Echocardiography is a painless test that uses sound waves to create images of your heart. It provides your doctor with information about the size and shape of your heart and how well your heart's chambers and valves are working. This procedure takes approximately one hour. There are no restrictions for this procedure.   Follow-Up: At [facility], you and your health needs are our priority.  As part of our continuing mission to provide you with exceptional heart care, we have created designated Provider Care Teams.  These Care Teams include your primary Cardiologist (physician) and Advanced Practice Providers (APPs -  Physician Assistants and Nurse Practitioners) who all work together to provide you with the care you need, when you need it.  We recommend signing up for the patient portal called "MyChart".  Sign up information is provided on this After Visit Summary.  MyChart is used to connect with patients for Virtual Visits (Telemedicine).  Patients are able to view lab/test results, encounter notes, upcoming appointments, etc.  Non-urgent messages can be sent to your provider as well.   To learn more about what you can do with MyChart, go to  [email or web address].    Your next appointment:   6 month(s)  The format for your next appointment:   In Person  Provider:   Jenne Campus, MD   Other Instructions  Echocardiogram An echocardiogram is a test that uses sound waves (ultrasound) to produce images of the heart. Images from an echocardiogram can provide important information about:  Heart size and shape.  The size and thickness and movement of your heart's walls.  Heart muscle function and strength.  Heart valve function or if you have stenosis. Stenosis is when the heart valves are too narrow.  If blood is flowing backward through the heart valves (regurgitation).  A tumor or infectious growth around the heart valves.  Areas of heart muscle that are not working well because of poor blood flow or injury from a heart attack.  Aneurysm detection. An aneurysm is a weak or damaged part of an artery wall. The wall bulges out from the normal force of blood pumping through the body. Tell a health care provider about:  Any allergies you have.  All medicines you are taking, including vitamins, herbs, eye drops, creams, and over-the-counter medicines.  Any blood disorders you have.  Any surgeries you have had.  Any medical conditions you have.  Whether you are pregnant or may be pregnant. What are the risks? Generally, this is a safe test. However, problems may occur, including an allergic reaction to dye (contrast) that may be used during the test. What happens before the test? No specific preparation is needed. You may eat and drink normally. What happens  during the test?  You will take off your clothes from the waist up and put on a hospital gown.  Electrodes or electrocardiogram (ECG)patches may be placed on your chest. The electrodes or patches are then connected to a device that monitors your heart rate and rhythm.  You will lie down on a table for an ultrasound exam. A gel will be applied to your  chest to help sound waves pass through your skin.  A handheld device, called a transducer, will be pressed against your chest and moved over your heart. The transducer produces sound waves that travel to your heart and bounce back (or "echo" back) to the transducer. These sound waves will be captured in real-time and changed into images of your heart that can be viewed on a video monitor. The images will be recorded on a computer and reviewed by your health care provider.  You may be asked to change positions or hold your breath for a short time. This makes it easier to get different views or better views of your heart.  In some cases, you may receive contrast through an IV in one of your veins. This can improve the quality of the pictures from your heart. The procedure may vary among health care providers and hospitals.   What can I expect after the test? You may return to your normal, everyday life, including diet, activities, and medicines, unless your health care provider tells you not to do that. Follow these instructions at home:  It is up to you to get the results of your test. Ask your health care provider, or the department that is doing the test, when your results will be ready.  Keep all follow-up visits. This is important. Summary  An echocardiogram is a test that uses sound waves (ultrasound) to produce images of the heart.  Images from an echocardiogram can provide important information about the size and shape of your heart, heart muscle function, heart valve function, and other possible heart problems.  You do not need to do anything to prepare before this test. You may eat and drink normally.  After the echocardiogram is completed, you may return to your normal, everyday life, unless your health care provider tells you not to do that. This information is not intended to replace advice given to you by your health care provider. Make sure you discuss any questions you have with  your health care provider. Document Revised: 12/28/2019 Document Reviewed: 12/28/2019 Elsevier Patient Education  2021 [employer].

## 2020-08-15 NOTE — Progress Notes (Signed)
Cardiology Office Note:    Date:  08/15/2020   ID:  Lorraine, Buckley [birth year], MRN [id number]  PCP:  Lorraine Cruel, MD  Cardiologist:  Jenne Campus, MD    Referring MD: Hulan Fess, MD   Chief Complaint  Patient presents with  . Follow-up  M doing fine  History of Present Illness:    Lorraine Buckley is a 75 y.o. female initially referred to [place] because of some altered mental status as a part of evaluation she was discovered to have cardiomyopathy with ejection fraction 45 to 50%, she was put on long-acting beta-blocker as well as ARB.  That comes to my office for follow-up overall doing well.  Denies have any chest pain tightness squeezing pressure burning chest.  Trying to be active and walk some have no problem doing this.  There is no more altered mental status.  Past Medical History:  Diagnosis Date  . Acute bronchitis 06/25/2011  . Acute memory impairment   . Altered mental status 10/08/2019  . Amnesia   . Arthritis    FINGERS AND NECK HURT  . Asthma    ALLERGY INDUCED ASTHMA  . Asthma 06/25/2011  . Atypical chest pain 10/08/2019  . Body mass index (BMI) 35.0-35.9, adult 08/14/2020  . Cardiac arrhythmia 08/14/2020  . Cholelithiasis with cholecystitis 03/21/2014  . Decreased estrogen level 08/14/2020  . Dilated cardiomyopathy (HCC) ejection fraction 45 to 50% 01/17/2020  . Dyslipidemia 10/08/2019  . Essential hypertension   . Flu-like symptoms 06/25/2011  . Gallstones    ABD PAIN, BAD GAS. FREQ STOOLS  . Heart murmur    PT NOT SURE IF MURMUR STILL HEARD - DOES NOT CAUSE ANY SYMPTOMS  . Hyperlipidemia   . Hypertension   . Hypothyroidism   . Impaired fasting glucose 08/14/2020  . Mild persistent asthma, uncomplicated 5/63/1497  . Multinodular goiter 10/16/2011  . Pneumonia    ABOUT FEB 2013  . PONV (postoperative nausea and vomiting)   . Shortness of breath dyspnea    OCCAS SOB  . Thyroid disease   . Thyroid nodule   . Vitamin D deficiency     Past  Surgical History:  Procedure Laterality Date  . ABDOMINAL HYSTERECTOMY    . BUNIONECTOMY     RIGHT BIG TOE  . CHOLECYSTECTOMY N/A 03/25/2014   Procedure: LAPAROSCOPIC CHOLECYSTECTOMY WITH INTRAOPERATIVE CHOLANGIOGRAM;  Surgeon: Armandina Gemma, MD;  Location: [facility];  Service: General;  Laterality: N/A;  . EYE SURGERY     LASIK EYE SURGERY  . TUBAL LIGATION      Current Medications: Current Meds  Medication Sig  . albuterol (PROVENTIL HFA;VENTOLIN HFA) 108 (90 BASE) MCG/ACT inhaler Inhale 2 puffs into the lungs every 4 (four) hours as needed for wheezing. For breathing  . cholecalciferol (VITAMIN D) 1000 UNITS tablet Take 1,000 Units by mouth daily.  . fexofenadine (ALLEGRA) 180 MG tablet Take 180 mg by mouth daily.  . fluticasone furoate-vilanterol (BREO ELLIPTA) 200-25 MCG/INH AEPB Inhale 1 puff into the lungs daily.  [name] levothyroxine (SYNTHROID, LEVOTHROID) 100 MCG tablet Take 100 mcg by mouth daily before breakfast.   . losartan (COZAAR) 25 MG tablet TAKE 1 TABLET BY MOUTH EVERY DAY (Patient taking differently: Take 25 mg by mouth daily.)  . metoprolol succinate (TOPROL-XL) 50 MG 24 hr tablet TAKE 1 TABLET (50 MG TOTAL) BY MOUTH DAILY. TAKE WITH OR IMMEDIATELY FOLLOWING A MEAL.  . Multiple Vitamin (MULTIVITAMIN PO) Take 1 tablet by mouth daily. Unknown strength per patient  .  RESTASIS 0.05 % ophthalmic emulsion Place 1 drop into both eyes daily.      Allergies:   Penicillins   Social History   Socioeconomic History  . Marital status: Married    Spouse name: Not on file  . Number of children: Not on file  . Years of education: Not on file  . Highest education level: Not on file  Occupational History  . Not on file  Tobacco Use  . Smoking status: Never Smoker  . Smokeless tobacco: Never Used  Substance and Sexual Activity  . Alcohol use: No  . Drug use: No  . Sexual activity: Not Currently  Other Topics Concern  . Not on file  Social History Narrative  . Not on file    Social Determinants of Health   Financial Resource Strain: Not on file  Food Insecurity: Not on file  Transportation Needs: Not on file  Physical Activity: Not on file  Stress: Not on file  Social Connections: Not on file     Family History: The patient's family history includes Asthma in her mother; Breast cancer in her maternal grandmother; Emphysema in her maternal grandfather; Heart disease in her maternal grandfather; Hypertension in her mother; Irritable bowel syndrome in her brother, mother, and sister. ROS:   Please see the history of present illness.    All 14 point review of systems negative except as described per history of present illness  EKGs/Labs/Other Studies Reviewed:      Recent Labs: 02/14/2020: BUN 12; Creatinine, Ser 0.85; Potassium 4.4; Sodium 141  Recent Lipid Panel No results found for: CHOL, TRIG, HDL, CHOLHDL, VLDL, LDLCALC, LDLDIRECT  Physical Exam:    VS:  BP 124/74 (BP Location: Right Arm, Patient Position: Sitting)   Pulse 64   Ht 5\' 7"  (1.702 m)   Wt 240 lb (108.9 kg)   SpO2 96%   BMI 37.59 kg/m     Wt Readings from Last 3 Encounters:  08/15/20 240 lb (108.9 kg)  01/17/20 241 lb (109.3 kg)  01/06/20 239 lb 5 oz (108.6 kg)     GEN:  Well nourished, well developed in no acute distress HEENT: Normal NECK: No JVD; No carotid bruits LYMPHATICS: No lymphadenopathy CARDIAC: RRR, no murmurs, no rubs, no gallops RESPIRATORY:  Clear to auscultation without rales, wheezing or rhonchi  ABDOMEN: Soft, non-tender, non-distended MUSCULOSKELETAL:  No edema; No deformity  SKIN: Warm and dry LOWER EXTREMITIES: no swelling NEUROLOGIC:  Alert and oriented x 3 PSYCHIATRIC:  Normal affect   ASSESSMENT:    1. Dilated cardiomyopathy (HCC) ejection fraction 45 to 50%   2. Coronary artery disease involving native coronary artery of native heart without angina pectoris   3. Essential hypertension   4. Dyslipidemia    PLAN:    In order of problems  listed above:  1. Dilated cardiomyopathy.  We will schedule her to have repeated echocardiogram in December of this year, she is already on medication however dosages not to be low.  Based on echocardiogram we will decide if we need to augment her therapy.  Luckily clinically she is doing well and she is hemodynamically compensated. 2. Essential hypertension, blood pressure well controlled continue present management. 3. Dyslipidemia I did review her K PN which show me her LDL of 129 and HDL 54 this is from March of this year.  I we did try 1 statin she had difficulty tolerating it.  I will initiate pravastatin 20 mg daily.  I did calculated her 10-year risk  predicted risk for cardiac event which came up as 16.5 which is intermediate.  She need to be on statin. 4. Coronary disease insignificant hemodynamically based on coronary CT angio.  We will continue risk factors modifications.   Medication Adjustments/Labs and Tests Ordered: Current medicines are reviewed at length with the patient today.  Concerns regarding medicines are outlined above.  No orders of the defined types were placed in this encounter.  Medication changes: No orders of the defined types were placed in this encounter.   Signed, Park Liter, MD, [facility] 08/15/2020 9:41 AM    [name]

## 2020-08-23 DIAGNOSIS — R140 Abdominal distension (gaseous): Secondary | ICD-10-CM | POA: Diagnosis not present

## 2020-08-23 DIAGNOSIS — Z1211 Encounter for screening for malignant neoplasm of colon: Secondary | ICD-10-CM | POA: Diagnosis not present

## 2020-10-03 DIAGNOSIS — H40013 Open angle with borderline findings, low risk, bilateral: Secondary | ICD-10-CM | POA: Diagnosis not present

## 2020-10-09 DIAGNOSIS — I10 Essential (primary) hypertension: Secondary | ICD-10-CM | POA: Diagnosis not present

## 2020-10-09 DIAGNOSIS — I420 Dilated cardiomyopathy: Secondary | ICD-10-CM | POA: Diagnosis not present

## 2020-10-09 DIAGNOSIS — I2510 Atherosclerotic heart disease of native coronary artery without angina pectoris: Secondary | ICD-10-CM | POA: Diagnosis not present

## 2020-10-09 DIAGNOSIS — E785 Hyperlipidemia, unspecified: Secondary | ICD-10-CM | POA: Diagnosis not present

## 2020-10-10 DIAGNOSIS — L570 Actinic keratosis: Secondary | ICD-10-CM | POA: Diagnosis not present

## 2020-10-10 DIAGNOSIS — L578 Other skin changes due to chronic exposure to nonionizing radiation: Secondary | ICD-10-CM | POA: Diagnosis not present

## 2020-10-10 DIAGNOSIS — D225 Melanocytic nevi of trunk: Secondary | ICD-10-CM | POA: Diagnosis not present

## 2020-10-10 DIAGNOSIS — L814 Other melanin hyperpigmentation: Secondary | ICD-10-CM | POA: Diagnosis not present

## 2020-10-10 DIAGNOSIS — L821 Other seborrheic keratosis: Secondary | ICD-10-CM | POA: Diagnosis not present

## 2020-10-10 DIAGNOSIS — L820 Inflamed seborrheic keratosis: Secondary | ICD-10-CM | POA: Diagnosis not present

## 2020-10-10 DIAGNOSIS — D239 Other benign neoplasm of skin, unspecified: Secondary | ICD-10-CM | POA: Diagnosis not present

## 2020-10-10 LAB — HEPATIC FUNCTION PANEL
ALT: 15 IU/L (ref 0–32)
AST: 18 IU/L (ref 0–40)
Albumin: 4 g/dL (ref 3.7–4.7)
Alkaline Phosphatase: 93 IU/L (ref 44–121)
Bilirubin Total: 0.6 mg/dL (ref 0.0–1.2)
Bilirubin, Direct: 0.18 mg/dL (ref 0.00–0.40)
Total Protein: 6.5 g/dL (ref 6.0–8.5)

## 2020-10-10 LAB — LIPID PANEL
Chol/HDL Ratio: 2.8 ratio (ref 0.0–4.4)
Cholesterol, Total: 155 mg/dL (ref 100–199)
HDL: 56 mg/dL (ref >39)
LDL Chol Calc (NIH): 80 mg/dL (ref 0–99)
Triglycerides: 102 mg/dL (ref 0–149)
VLDL Cholesterol Cal: 19 mg/dL (ref 5–40)

## 2020-10-11 ENCOUNTER — Telehealth: Payer: Self-pay | Admitting: Cardiology

## 2020-10-11 NOTE — Telephone Encounter (Signed)
Called patient informed her of results.  

## 2020-10-11 NOTE — Telephone Encounter (Signed)
Patient is returning cal to discuss lab results.

## 2020-10-25 ENCOUNTER — Other Ambulatory Visit: Payer: Self-pay

## 2020-10-25 ENCOUNTER — Ambulatory Visit (HOSPITAL_COMMUNITY): Payer: PPO | Attending: Cardiovascular Disease

## 2020-10-25 DIAGNOSIS — I2510 Atherosclerotic heart disease of native coronary artery without angina pectoris: Secondary | ICD-10-CM | POA: Diagnosis not present

## 2020-10-25 DIAGNOSIS — I10 Essential (primary) hypertension: Secondary | ICD-10-CM | POA: Diagnosis not present

## 2020-10-25 DIAGNOSIS — I420 Dilated cardiomyopathy: Secondary | ICD-10-CM | POA: Diagnosis not present

## 2020-10-25 DIAGNOSIS — E785 Hyperlipidemia, unspecified: Secondary | ICD-10-CM | POA: Diagnosis not present

## 2020-10-25 LAB — ECHOCARDIOGRAM COMPLETE
Area-P 1/2: 2.2 cm2
S' Lateral: 4.1 cm
Single Plane A4C EF: 50 %

## 2020-10-25 MED ORDER — PERFLUTREN LIPID MICROSPHERE
1.0000 mL | INTRAVENOUS | Status: AC | PRN
  Administered 2020-10-25: 3 mL via INTRAVENOUS

## 2020-11-02 DIAGNOSIS — H00025 Hordeolum internum left lower eyelid: Secondary | ICD-10-CM | POA: Diagnosis not present

## 2020-11-02 DIAGNOSIS — H00015 Hordeolum externum left lower eyelid: Secondary | ICD-10-CM | POA: Diagnosis not present

## 2020-11-17 DIAGNOSIS — K635 Polyp of colon: Secondary | ICD-10-CM | POA: Diagnosis not present

## 2020-11-17 DIAGNOSIS — D122 Benign neoplasm of ascending colon: Secondary | ICD-10-CM | POA: Diagnosis not present

## 2020-11-17 DIAGNOSIS — Z8371 Family history of colonic polyps: Secondary | ICD-10-CM | POA: Diagnosis not present

## 2020-11-17 DIAGNOSIS — K5730 Diverticulosis of large intestine without perforation or abscess without bleeding: Secondary | ICD-10-CM | POA: Diagnosis not present

## 2020-11-22 DIAGNOSIS — D122 Benign neoplasm of ascending colon: Secondary | ICD-10-CM | POA: Diagnosis not present

## 2020-11-23 DIAGNOSIS — Z20822 Contact with and (suspected) exposure to covid-19: Secondary | ICD-10-CM | POA: Diagnosis not present

## 2021-02-01 ENCOUNTER — Ambulatory Visit
Admission: RE | Admit: 2021-02-01 | Discharge: 2021-02-01 | Disposition: A | Discharge status: Home / Self Care | Payer: PPO | Source: Ambulatory Visit | Attending: Family Medicine | Admitting: Family Medicine

## 2021-02-01 ENCOUNTER — Other Ambulatory Visit: Payer: Self-pay

## 2021-02-01 DIAGNOSIS — Z1231 Encounter for screening mammogram for malignant neoplasm of breast: Secondary | ICD-10-CM

## 2021-02-01 DIAGNOSIS — E2839 Other primary ovarian failure: Secondary | ICD-10-CM

## 2021-02-01 DIAGNOSIS — Z780 Asymptomatic menopausal state: Secondary | ICD-10-CM | POA: Diagnosis not present

## 2021-02-01 DIAGNOSIS — M85851 Other specified disorders of bone density and structure, right thigh: Secondary | ICD-10-CM | POA: Diagnosis not present

## 2021-02-02 ENCOUNTER — Ambulatory Visit: Payer: PPO | Admitting: Cardiology

## 2021-02-02 ENCOUNTER — Encounter: Payer: Self-pay | Admitting: Cardiology

## 2021-02-02 ENCOUNTER — Telehealth: Payer: Self-pay

## 2021-02-02 ENCOUNTER — Other Ambulatory Visit: Payer: Self-pay

## 2021-02-02 VITALS — BP 160/92 | HR 78 | Ht 66.5 in | Wt 244.0 lb

## 2021-02-02 DIAGNOSIS — I2510 Atherosclerotic heart disease of native coronary artery without angina pectoris: Secondary | ICD-10-CM | POA: Diagnosis not present

## 2021-02-02 DIAGNOSIS — I420 Dilated cardiomyopathy: Secondary | ICD-10-CM | POA: Diagnosis not present

## 2021-02-02 DIAGNOSIS — I10 Essential (primary) hypertension: Secondary | ICD-10-CM | POA: Diagnosis not present

## 2021-02-02 DIAGNOSIS — E785 Hyperlipidemia, unspecified: Secondary | ICD-10-CM

## 2021-02-02 DIAGNOSIS — D1801 Hemangioma of skin and subcutaneous tissue: Secondary | ICD-10-CM | POA: Insufficient documentation

## 2021-02-02 DIAGNOSIS — Z79899 Other long term (current) drug therapy: Secondary | ICD-10-CM | POA: Diagnosis not present

## 2021-02-02 DIAGNOSIS — L814 Other melanin hyperpigmentation: Secondary | ICD-10-CM | POA: Insufficient documentation

## 2021-02-02 DIAGNOSIS — L821 Other seborrheic keratosis: Secondary | ICD-10-CM | POA: Insufficient documentation

## 2021-02-02 DIAGNOSIS — D225 Melanocytic nevi of trunk: Secondary | ICD-10-CM | POA: Insufficient documentation

## 2021-02-02 MED ORDER — LOSARTAN POTASSIUM 50 MG PO TABS: 50.0000 mg | ORAL_TABLET | Freq: Every day | ORAL | 3 refills | Status: DC

## 2021-02-02 NOTE — Progress Notes (Signed)
Cardiology Office Note:    Date:  02/02/2021   ID:  Ellysia, Finlinson 23-Dec-1945, MRN [id number]  PCP:  Lawerance Cruel, MD  Cardiologist:  Jenne Campus, MD    Referring MD: Lawerance Cruel, MD   Chief Complaint  Patient presents with   Follow-up  I am doing fine  History of Present Illness:    CHRISTI HEELAN is a 75 y.o. female with past medical history significant for altered mental status and this is the reason why she was sent to interestingly work-up showed cardiomyopathy with ejection fraction 45 to 50%.  She was put on long-acting beta-blocker as well as ARB.  She comes today to my office for follow-up overall she is doing well.  Recently she finished trip to [place] and she enjoyed this a lot and she had no difficulty walking around there.  She is concerned about her blood pressure being uncontrolled which is reasonable concern.  Past Medical History:  Diagnosis Date   Acute bronchitis 06/25/2011   Acute memory impairment    Altered mental status 10/08/2019   Amnesia    Arthritis    FINGERS AND NECK HURT   Asthma    ALLERGY INDUCED ASTHMA   Asthma 06/25/2011   Atypical chest pain 10/08/2019   Body mass index (BMI) 35.0-35.9, adult 08/14/2020   Cardiac arrhythmia 08/14/2020   Cholelithiasis with cholecystitis 03/21/2014   Decreased estrogen level 08/14/2020   Dilated cardiomyopathy (HCC) ejection fraction 45 to 50% 01/17/2020   Dyslipidemia 10/08/2019   Essential hypertension    Flu-like symptoms 06/25/2011   Gallstones    ABD PAIN, BAD GAS. FREQ STOOLS   Heart murmur    PT NOT SURE IF MURMUR STILL HEARD - DOES NOT CAUSE ANY SYMPTOMS   Hyperlipidemia    Hypertension    Hypothyroidism    Impaired fasting glucose 08/14/2020   Mild persistent asthma, uncomplicated 0000000   Multinodular goiter 10/16/2011   Pneumonia    ABOUT FEB 2013   PONV (postoperative nausea and vomiting)    Shortness of breath dyspnea    OCCAS SOB   Thyroid disease    Thyroid nodule     Vitamin D deficiency     Past Surgical History:  Procedure Laterality Date   ABDOMINAL HYSTERECTOMY     BUNIONECTOMY     RIGHT BIG TOE   CHOLECYSTECTOMY N/A 03/25/2014   Procedure: LAPAROSCOPIC CHOLECYSTECTOMY WITH INTRAOPERATIVE CHOLANGIOGRAM;  Surgeon: Armandina Gemma, MD;  Location: [facility];  Service: General;  Laterality: N/A;   EYE SURGERY     LASIK EYE SURGERY   TUBAL LIGATION      Current Medications: Current Meds  Medication Sig   albuterol (PROVENTIL HFA;VENTOLIN HFA) 108 (90 BASE) MCG/ACT inhaler Inhale 2 puffs into the lungs every 4 (four) hours as needed for wheezing. For breathing   cholecalciferol (VITAMIN D) 1000 UNITS tablet Take 1,000 Units by mouth daily.   fexofenadine (ALLEGRA) 180 MG tablet Take 180 mg by mouth daily.   fluticasone furoate-vilanterol (BREO ELLIPTA) 200-25 MCG/INH AEPB Inhale 1 puff into the lungs daily.   levothyroxine (SYNTHROID) 100 MCG tablet Take 100 mcg by mouth daily.   losartan (COZAAR) 50 MG tablet Take 1 tablet (50 mg total) by mouth daily.   metoprolol succinate (TOPROL-XL) 50 MG 24 hr tablet TAKE 1 TABLET (50 MG TOTAL) BY MOUTH DAILY. TAKE WITH OR IMMEDIATELY FOLLOWING A MEAL. (Patient taking differently: Take 25 mg by mouth daily. Take with or immediately following a meal.)  Multiple Vitamin (MULTIVITAMIN PO) Take 1 tablet by mouth daily. Unknown strength per patient   pravastatin (PRAVACHOL) 20 MG tablet Take 1 tablet (20 mg total) by mouth every evening.   RESTASIS 0.05 % ophthalmic emulsion Place 1 drop into both eyes daily.    [DISCONTINUED] losartan (COZAAR) 25 MG tablet TAKE 1 TABLET BY MOUTH EVERY DAY (Patient taking differently: Take 25 mg by mouth daily.)     Allergies:   Penicillins and Sulfa antibiotics   Social History   Socioeconomic History   Marital status: Married    Spouse name: Not on file   Number of children: Not on file   Years of education: Not on file   Highest education level: Not on file  Occupational  History   Not on file  Tobacco Use   Smoking status: Never   Smokeless tobacco: Never  Substance and Sexual Activity   Alcohol use: No   Drug use: No   Sexual activity: Not Currently  Other Topics Concern   Not on file  Social History Narrative   Not on file   Social Determinants of Health   Financial Resource Strain: Not on file  Food Insecurity: Not on file  Transportation Needs: Not on file  Physical Activity: Not on file  Stress: Not on file  Social Connections: Not on file     Family History: The patient's family history includes Asthma in her mother; Breast cancer in her maternal grandmother; Emphysema in her maternal grandfather; Heart disease in her maternal grandfather; Hypertension in her mother; Irritable bowel syndrome in her brother, mother, and sister. ROS:   Please see the history of present illness.    All 14 point review of systems negative except as described per history of present illness  EKGs/Labs/Other Studies Reviewed:      Recent Labs: 02/14/2020: BUN 12; Creatinine, Ser 0.85; Potassium 4.4; Sodium 141 10/09/2020: ALT 15  Recent Lipid Panel    Component Value Date/Time   CHOL 155 10/09/2020 0813   TRIG 102 10/09/2020 0813   HDL 56 10/09/2020 0813   CHOLHDL 2.8 10/09/2020 0813   LDLCALC 80 10/09/2020 0813    Physical Exam:    VS:  BP (!) 160/92 (BP Location: Left Arm, Patient Position: Sitting)   Pulse 78   Ht 5' 6.5" (1.689 m)   Wt 244 lb (110.7 kg)   SpO2 98%   BMI 38.79 kg/m     Wt Readings from Last 3 Encounters:  02/02/21 244 lb (110.7 kg)  08/15/20 240 lb (108.9 kg)  01/17/20 241 lb (109.3 kg)     GEN:  Well nourished, well developed in no acute distress HEENT: Normal NECK: No JVD; No carotid bruits LYMPHATICS: No lymphadenopathy CARDIAC: RRR, no murmurs, no rubs, no gallops RESPIRATORY:  Clear to auscultation without rales, wheezing or rhonchi  ABDOMEN: Soft, non-tender, non-distended MUSCULOSKELETAL:  No edema; No  deformity  SKIN: Warm and dry LOWER EXTREMITIES: no swelling NEUROLOGIC:  Alert and oriented x 3 PSYCHIATRIC:  Normal affect   ASSESSMENT:    1. Dilated cardiomyopathy (HCC) ejection fraction 45 to 50%   2. Medication management   3. Coronary artery disease involving native coronary artery of native heart without angina pectoris   4. Essential hypertension   5. Dyslipidemia    PLAN:    In order of problems listed above:  Essential hypertension: Uncontrolled.  I will increase dose of losartan we will check Chem-7 next week.  See him back in my office in  about 6 weeks to make sure blood pressure is well controlled. History of dilated cardiomyopathy increase ARB she is already on beta-blocker which I will continue.  Echocardiogram will be done in December. Coronary disease only minimal.  We will continue monitoring and present management. Dyslipidemia I did review her K PN which show LDL of 80 HDL 56 this is from May of this year.  Continue present management which include pravastatin.   Medication Adjustments/Labs and Tests Ordered: Current medicines are reviewed at length with the patient today.  Concerns regarding medicines are outlined above.  Orders Placed This Encounter  Procedures   Basic Metabolic Panel (BMET)   ECHOCARDIOGRAM COMPLETE   Medication changes:  Meds ordered this encounter  Medications   losartan (COZAAR) 50 MG tablet    Sig: Take 1 tablet (50 mg total) by mouth daily.    Dispense:  90 tablet    Refill:  3    Signed, Park Liter, MD, [facility] 02/02/2021 12:01 PM    [place]

## 2021-02-02 NOTE — Addendum Note (Signed)
Addended by: Orvan July on: 02/02/2021 01:33 PM   Modules accepted: Orders

## 2021-02-02 NOTE — Telephone Encounter (Signed)
Called pt to see if she wanted to come in sooner, left a message for her to call back.

## 2021-02-02 NOTE — Patient Instructions (Addendum)
Medication Instructions:  Your physician has recommended you make the following change in your medication:  INCREASE: Losartan 50 mg once daily *If you need a refill on your cardiac medications before your next appointment, please call your pharmacy*   Lab Work: Your physician recommends that you return for lab work in:  NEXT WEEK: BMET If you have labs (blood work) drawn today and your tests are completely normal, you will receive your results only by: [employer] (if you have MyChart) OR A paper copy in the mail If you have any lab test that is abnormal or we need to change your treatment, we will call you to review the results.   Testing/Procedures: Your physician has requested that you have an echocardiogram. Echocardiography is a painless test that uses sound waves to create images of your heart. It provides your doctor with information about the size and shape of your heart and how well your heart's chambers and valves are working. This procedure takes approximately one hour. There are no restrictions for this procedure.    Follow-Up: At [facility], you and your health needs are our priority.  As part of our continuing mission to provide you with exceptional heart care, we have created designated Provider Care Teams.  These Care Teams include your primary Cardiologist (physician) and Advanced Practice Providers (APPs -  Physician Assistants and Nurse Practitioners) who all work together to provide you with the care you need, when you need it.  We recommend signing up for the patient portal called "MyChart".  Sign up information is provided on this After Visit Summary.  MyChart is used to connect with patients for Virtual Visits (Telemedicine).  Patients are able to view lab/test results, encounter notes, upcoming appointments, etc.  Non-urgent messages can be sent to your provider as well.   To learn more about what you can do with MyChart, go to [email or web address].     Your next appointment:   6 week(s)  The format for your next appointment:   In Person  Provider:   Jenne Campus, MD   Other Instructions

## 2021-02-07 ENCOUNTER — Other Ambulatory Visit: Payer: Self-pay | Admitting: Family Medicine

## 2021-02-07 DIAGNOSIS — R928 Other abnormal and inconclusive findings on diagnostic imaging of breast: Secondary | ICD-10-CM

## 2021-02-14 DIAGNOSIS — M25522 Pain in left elbow: Secondary | ICD-10-CM | POA: Diagnosis not present

## 2021-02-14 DIAGNOSIS — M67814 Other specified disorders of tendon, left shoulder: Secondary | ICD-10-CM | POA: Diagnosis not present

## 2021-02-14 DIAGNOSIS — M25512 Pain in left shoulder: Secondary | ICD-10-CM | POA: Insufficient documentation

## 2021-02-14 DIAGNOSIS — M7522 Bicipital tendinitis, left shoulder: Secondary | ICD-10-CM | POA: Diagnosis not present

## 2021-02-14 DIAGNOSIS — M778 Other enthesopathies, not elsewhere classified: Secondary | ICD-10-CM | POA: Insufficient documentation

## 2021-02-20 DIAGNOSIS — Z79899 Other long term (current) drug therapy: Secondary | ICD-10-CM | POA: Diagnosis not present

## 2021-02-21 LAB — BASIC METABOLIC PANEL
BUN/Creatinine Ratio: 16 (ref 12–28)
BUN: 14 mg/dL (ref 8–27)
CO2: 24 mmol/L (ref 20–29)
Calcium: 9.8 mg/dL (ref 8.7–10.3)
Chloride: 102 mmol/L (ref 96–106)
Creatinine, Ser: 0.87 mg/dL (ref 0.57–1.00)
Glucose: 109 mg/dL — ABNORMAL HIGH (ref 70–99)
Potassium: 4.5 mmol/L (ref 3.5–5.2)
Sodium: 140 mmol/L (ref 134–144)
eGFR: 70 mL/min/{1.73_m2} (ref >59)

## 2021-02-22 ENCOUNTER — Ambulatory Visit
Admission: RE | Admit: 2021-02-22 | Discharge: 2021-02-22 | Disposition: A | Discharge status: Home / Self Care | Payer: PPO | Source: Ambulatory Visit | Attending: Family Medicine | Admitting: Family Medicine

## 2021-02-22 ENCOUNTER — Other Ambulatory Visit: Payer: Self-pay

## 2021-02-22 ENCOUNTER — Other Ambulatory Visit: Payer: Self-pay | Admitting: Family Medicine

## 2021-02-22 DIAGNOSIS — R928 Other abnormal and inconclusive findings on diagnostic imaging of breast: Secondary | ICD-10-CM

## 2021-02-22 DIAGNOSIS — R922 Inconclusive mammogram: Secondary | ICD-10-CM | POA: Diagnosis not present

## 2021-02-23 ENCOUNTER — Telehealth: Payer: Self-pay

## 2021-02-23 NOTE — Telephone Encounter (Signed)
-----   Message from Park Liter, MD sent at 02/23/2021  8:43 AM EDT ----- Labs are looking good, continue present management

## 2021-02-23 NOTE — Telephone Encounter (Signed)
Left message on patients voicemail to please return our call.   

## 2021-03-22 ENCOUNTER — Other Ambulatory Visit: Payer: Self-pay

## 2021-03-26 ENCOUNTER — Other Ambulatory Visit: Payer: Self-pay

## 2021-03-26 ENCOUNTER — Encounter: Payer: Self-pay | Admitting: Cardiology

## 2021-03-26 ENCOUNTER — Ambulatory Visit: Payer: PPO | Admitting: Cardiology

## 2021-03-26 VITALS — BP 136/82 | HR 69 | Ht 67.0 in | Wt 243.0 lb

## 2021-03-26 DIAGNOSIS — I420 Dilated cardiomyopathy: Secondary | ICD-10-CM | POA: Diagnosis not present

## 2021-03-26 DIAGNOSIS — I2510 Atherosclerotic heart disease of native coronary artery without angina pectoris: Secondary | ICD-10-CM | POA: Diagnosis not present

## 2021-03-26 DIAGNOSIS — I10 Essential (primary) hypertension: Secondary | ICD-10-CM

## 2021-03-26 DIAGNOSIS — R0789 Other chest pain: Secondary | ICD-10-CM | POA: Diagnosis not present

## 2021-03-26 DIAGNOSIS — K5730 Diverticulosis of large intestine without perforation or abscess without bleeding: Secondary | ICD-10-CM | POA: Insufficient documentation

## 2021-03-26 MED ORDER — METOPROLOL SUCCINATE ER 50 MG PO TB24: 50.0000 mg | ORAL_TABLET | Freq: Every day | ORAL | 1 refills | Status: DC

## 2021-03-26 MED ORDER — LOSARTAN POTASSIUM 100 MG PO TABS: 100.0000 mg | ORAL_TABLET | Freq: Every day | ORAL | 1 refills | Status: DC

## 2021-03-26 NOTE — Progress Notes (Signed)
Cardiology Office Note:    Date:  03/26/2021   ID:  Jamesetta, Greenhalgh [birth year], MRN [id number]  PCP:  Lawerance Cruel, MD  Cardiologist:  Jenne Campus, MD    Referring MD: Lawerance Cruel, MD   Chief Complaint  Patient presents with   Medication Management  Am doing fine  History of Present Illness:    Lorraine Buckley is a 75 y.o. female with past medical history significant for cardiomyopathy with ejection fraction 45 to 50%, essential hypertension, minimal coronary artery disease.  Comes today to my office for follow-up overall she is doing fine denies have any chest pain tightness squeezing pressure when chest.  She is on appropriate medication which I will continue.  Past Medical History:  Diagnosis Date   Acute bronchitis 06/25/2011   Acute memory impairment    Altered mental status 10/08/2019   Amnesia    Arthritis    FINGERS AND NECK HURT   Asthma    ALLERGY INDUCED ASTHMA   Asthma 06/25/2011   Atypical chest pain 10/08/2019   Body mass index (BMI) 35.0-35.9, adult 08/14/2020   Cardiac arrhythmia 08/14/2020   Cholelithiasis with cholecystitis 03/21/2014   Decreased estrogen level 08/14/2020   Dilated cardiomyopathy (HCC) ejection fraction 45 to 50% 01/17/2020   Dyslipidemia 10/08/2019   Essential hypertension    Flu-like symptoms 06/25/2011   Gallstones    ABD PAIN, BAD GAS. FREQ STOOLS   Heart murmur    PT NOT SURE IF MURMUR STILL HEARD - DOES NOT CAUSE ANY SYMPTOMS   Hyperlipidemia    Hypertension    Hypothyroidism    Impaired fasting glucose 08/14/2020   Mild persistent asthma, uncomplicated 9/56/3875   Multinodular goiter 10/16/2011   Pneumonia    ABOUT FEB 2013   PONV (postoperative nausea and vomiting)    Shortness of breath dyspnea    OCCAS SOB   Thyroid disease    Thyroid nodule    Vitamin D deficiency     Past Surgical History:  Procedure Laterality Date   ABDOMINAL HYSTERECTOMY     BUNIONECTOMY     RIGHT BIG TOE   CHOLECYSTECTOMY N/A  03/25/2014   Procedure: LAPAROSCOPIC CHOLECYSTECTOMY WITH INTRAOPERATIVE CHOLANGIOGRAM;  Surgeon: Armandina Gemma, MD;  Location: [facility];  Service: General;  Laterality: N/A;   EYE SURGERY     LASIK EYE SURGERY   TUBAL LIGATION      Current Medications: Current Meds  Medication Sig   albuterol (PROVENTIL HFA;VENTOLIN HFA) 108 (90 BASE) MCG/ACT inhaler Inhale 2 puffs into the lungs every 4 (four) hours as needed for wheezing. For breathing   aspirin 81 MG EC tablet Take 1 tablet by mouth daily.   cholecalciferol (VITAMIN D) 1000 UNITS tablet Take 1,000 Units by mouth daily.   fexofenadine (ALLEGRA) 180 MG tablet Take 180 mg by mouth daily.   fluticasone furoate-vilanterol (BREO ELLIPTA) 200-25 MCG/INH AEPB Inhale 1 puff into the lungs daily.   levothyroxine (SYNTHROID) 100 MCG tablet Take 100 mcg by mouth daily.   losartan (COZAAR) 100 MG tablet Take 1 tablet by mouth daily.   metoprolol succinate (TOPROL-XL) 50 MG 24 hr tablet TAKE 1 TABLET (50 MG TOTAL) BY MOUTH DAILY. TAKE WITH OR IMMEDIATELY FOLLOWING A MEAL.   Multiple Vitamin (MULTIVITAMIN PO) Take 1 tablet by mouth daily. Unknown strength per patient   pravastatin (PRAVACHOL) 20 MG tablet Take 1 tablet (20 mg total) by mouth every evening.   RESTASIS 0.05 % ophthalmic emulsion Place 1 drop  into both eyes daily.      Allergies:   Penicillins and Sulfa antibiotics   Social History   Socioeconomic History   Marital status: Married    Spouse name: Not on file   Number of children: Not on file   Years of education: Not on file   Highest education level: Not on file  Occupational History   Not on file  Tobacco Use   Smoking status: Never   Smokeless tobacco: Never  Substance and Sexual Activity   Alcohol use: No   Drug use: No   Sexual activity: Not Currently  Other Topics Concern   Not on file  Social History Narrative   Not on file   Social Determinants of Health   Financial Resource Strain: Not on file  Food  Insecurity: Not on file  Transportation Needs: Not on file  Physical Activity: Not on file  Stress: Not on file  Social Connections: Not on file     Family History: The patient's family history includes Asthma in her mother; Breast cancer in her maternal grandmother; Emphysema in her maternal grandfather; Heart disease in her maternal grandfather; Hypertension in her mother; Irritable bowel syndrome in her brother, mother, and sister. ROS:   Please see the history of present illness.    All 14 point review of systems negative except as described per history of present illness  EKGs/Labs/Other Studies Reviewed:      Recent Labs: 10/09/2020: ALT 15 02/20/2021: BUN 14; Creatinine, Ser 0.87; Potassium 4.5; Sodium 140  Recent Lipid Panel    Component Value Date/Time   CHOL 155 10/09/2020 0813   TRIG 102 10/09/2020 0813   HDL 56 10/09/2020 0813   CHOLHDL 2.8 10/09/2020 0813   LDLCALC 80 10/09/2020 0813    Physical Exam:    VS:  BP 136/82 (BP Location: Left Arm, Patient Position: Sitting)   Pulse 69   Ht 5\' 7"  (1.702 m)   Wt 243 lb (110.2 kg)   SpO2 96%   BMI 38.06 kg/m     Wt Readings from Last 3 Encounters:  03/26/21 243 lb (110.2 kg)  02/02/21 244 lb (110.7 kg)  08/15/20 240 lb (108.9 kg)     GEN:  Well nourished, well developed in no acute distress HEENT: Normal NECK: No JVD; No carotid bruits LYMPHATICS: No lymphadenopathy CARDIAC: RRR, no murmurs, no rubs, no gallops RESPIRATORY:  Clear to auscultation without rales, wheezing or rhonchi  ABDOMEN: Soft, non-tender, non-distended MUSCULOSKELETAL:  No edema; No deformity  SKIN: Warm and dry LOWER EXTREMITIES: no swelling NEUROLOGIC:  Alert and oriented x 3 PSYCHIATRIC:  Normal affect   ASSESSMENT:    1. Dilated cardiomyopathy (HCC) ejection fraction 45 to 50%   2. Coronary artery disease involving native coronary artery of native heart without angina pectoris   3. Essential hypertension   4. Atypical chest  pain    PLAN:    In order of problems listed above:  Cardiomyopathy on beta-blocker as well as ARB recently dose of ARB has been increased from 50 mg to 1 mg daily, will check Chem-7 make sure kidney function potassium still fine.  She is already scheduled to have echocardiogram repeated in December which will be to check her left ventricle ejection fraction. Coronary artery disease, her coronary CT angio showed only minimal coronary artery disease.  We will continue risk factors modifications. Essential hypertension still uncontrolled.  Much better right now but still probably required slightly higher medications.  I will check a  Chem-7 today if Chem-7 is fine I will increase dose of metoprolol succinate to 75 mg daily.   Medication Adjustments/Labs and Tests Ordered: Current medicines are reviewed at length with the patient today.  Concerns regarding medicines are outlined above.  No orders of the defined types were placed in this encounter.  Medication changes: No orders of the defined types were placed in this encounter.   Signed, Park Liter, MD, [facility] 03/26/2021 11:35 AM    [place]

## 2021-03-26 NOTE — Patient Instructions (Signed)

## 2021-03-27 LAB — BASIC METABOLIC PANEL
BUN/Creatinine Ratio: 17 (ref 12–28)
BUN: 14 mg/dL (ref 8–27)
CO2: 23 mmol/L (ref 20–29)
Calcium: 9.4 mg/dL (ref 8.7–10.3)
Chloride: 103 mmol/L (ref 96–106)
Creatinine, Ser: 0.82 mg/dL (ref 0.57–1.00)
Glucose: 94 mg/dL (ref 70–99)
Potassium: 4.4 mmol/L (ref 3.5–5.2)
Sodium: 139 mmol/L (ref 134–144)
eGFR: 75 mL/min/{1.73_m2} (ref >59)

## 2021-03-30 ENCOUNTER — Telehealth: Payer: Self-pay

## 2021-03-30 NOTE — Telephone Encounter (Signed)
-----   Message from Park Liter, MD sent at 03/28/2021 11:12 AM EST ----- Chem-7 looks good, continue present management

## 2021-03-30 NOTE — Telephone Encounter (Signed)
Patient notified of test results 

## 2021-04-23 ENCOUNTER — Ambulatory Visit (HOSPITAL_BASED_OUTPATIENT_CLINIC_OR_DEPARTMENT_OTHER)
Admission: RE | Admit: 2021-04-23 | Discharge: 2021-04-23 | Disposition: A | Discharge status: Home / Self Care | Payer: PPO | Source: Ambulatory Visit | Attending: Cardiology | Admitting: Cardiology

## 2021-04-23 ENCOUNTER — Other Ambulatory Visit: Payer: Self-pay

## 2021-04-23 DIAGNOSIS — I420 Dilated cardiomyopathy: Secondary | ICD-10-CM | POA: Insufficient documentation

## 2021-04-23 LAB — ECHOCARDIOGRAM COMPLETE
AR max vel: 2.75 cm2
AV Area VTI: 2.9 cm2
AV Area mean vel: 2.63 cm2
AV Mean grad: 5 mmHg
AV Peak grad: 8 mmHg
Ao pk vel: 1.41 m/s
Area-P 1/2: 4.41 cm2
Calc EF: 47.3 %
S' Lateral: 4.3 cm
Single Plane A2C EF: 48.6 %
Single Plane A4C EF: 43.6 %

## 2021-04-23 MED ORDER — PERFLUTREN LIPID MICROSPHERE
1.0000 mL | INTRAVENOUS | Status: AC | PRN
  Administered 2021-04-23: 2 mL via INTRAVENOUS

## 2021-05-16 ENCOUNTER — Other Ambulatory Visit: Payer: Self-pay

## 2021-05-16 MED ORDER — PRAVASTATIN SODIUM 20 MG PO TABS: 20.0000 mg | ORAL_TABLET | Freq: Every evening | ORAL | 3 refills | Status: DC

## 2021-07-10 ENCOUNTER — Ambulatory Visit: Payer: PPO | Admitting: Cardiology

## 2021-07-10 ENCOUNTER — Other Ambulatory Visit: Payer: Self-pay

## 2021-07-10 ENCOUNTER — Encounter: Payer: Self-pay | Admitting: Cardiology

## 2021-07-10 VITALS — BP 154/92 | HR 72 | Ht 67.0 in | Wt 245.0 lb

## 2021-07-10 DIAGNOSIS — I10 Essential (primary) hypertension: Secondary | ICD-10-CM | POA: Diagnosis not present

## 2021-07-10 DIAGNOSIS — Z6835 Body mass index (BMI) 35.0-35.9, adult: Secondary | ICD-10-CM | POA: Diagnosis not present

## 2021-07-10 DIAGNOSIS — R0789 Other chest pain: Secondary | ICD-10-CM | POA: Diagnosis not present

## 2021-07-10 DIAGNOSIS — I2510 Atherosclerotic heart disease of native coronary artery without angina pectoris: Secondary | ICD-10-CM | POA: Diagnosis not present

## 2021-07-10 DIAGNOSIS — I420 Dilated cardiomyopathy: Secondary | ICD-10-CM

## 2021-07-10 MED ORDER — ENTRESTO 24-26 MG PO TABS: 1.0000 | ORAL_TABLET | Freq: Two times a day (BID) | ORAL | 3 refills | Status: DC

## 2021-07-10 NOTE — Patient Instructions (Signed)
Medication Instructions:  Your physician has recommended you make the following change in your medication:   START: Entresto 24/26 twice daily STOP: Losartan  *If you need a refill on your cardiac medications before your next appointment, please call your pharmacy*   Lab Work: Your physician recommends that you return for lab work in:   Labs today: BMP  If you have labs (blood work) drawn today and your tests are completely normal, you will receive your results only by: [place] (if you have MyChart) OR A paper copy in the mail If you have any lab test that is abnormal or we need to change your treatment, we will call you to review the results.   Testing/Procedures: None   Follow-Up: At [facility], you and your health needs are our priority.  As part of our continuing mission to provide you with exceptional heart care, we have created designated Provider Care Teams.  These Care Teams include your primary Cardiologist (physician) and Advanced Practice Providers (APPs -  Physician Assistants and Nurse Practitioners) who all work together to provide you with the care you need, when you need it.  We recommend signing up for the patient portal called "MyChart".  Sign up information is provided on this After Visit Summary.  MyChart is used to connect with patients for Virtual Visits (Telemedicine).  Patients are able to view lab/test results, encounter notes, upcoming appointments, etc.  Non-urgent messages can be sent to your provider as well.   To learn more about what you can do with MyChart, go to [email or web address].    Your next appointment:   4 month(s)  The format for your next appointment:   In Person  Provider:   Jenne Campus, MD    Other Instructions None

## 2021-07-10 NOTE — Progress Notes (Signed)
Cardiology Office Note:    Date:  07/10/2021   ID:  Lorraine Buckley 07/03/1945, MRN [id number]  PCP:  Lawerance Cruel, MD  Cardiologist:  Jenne Campus, MD    Referring MD: Lawerance Cruel, MD   No chief complaint on file. I am doing fine  History of Present Illness:    Lorraine Buckley is a 76 y.o. female who was diagnosed with cardiomyopathy with ejection fraction Nebido 45%.  As a part of work-up for etiology of this phenomenon coronary CT angio has been performed which showed nonobstructive disease.  She been doing well overall however latest echocardiogram done in December showed ejection fraction of 40 to 45% which is slightly less than before obviously that is concerning.  She is still participating in sports has no difficulty doing it.  She does have some exertional shortness of breath but overall seems to be doing well.  Past Medical History:  Diagnosis Date   Acute bronchitis 06/25/2011   Acute memory impairment    Altered mental status 10/08/2019   Amnesia    Arthritis    FINGERS AND NECK HURT   Asthma    ALLERGY INDUCED ASTHMA   Asthma 06/25/2011   Atypical chest pain 10/08/2019   Body mass index (BMI) 35.0-35.9, adult 08/14/2020   Cardiac arrhythmia 08/14/2020   Cholelithiasis with cholecystitis 03/21/2014   Decreased estrogen level 08/14/2020   Dilated cardiomyopathy (HCC) ejection fraction 45 to 50% 01/17/2020   Dyslipidemia 10/08/2019   Essential hypertension    Flu-like symptoms 06/25/2011   Gallstones    ABD PAIN, BAD GAS. FREQ STOOLS   Heart murmur    PT NOT SURE IF MURMUR STILL HEARD - DOES NOT CAUSE ANY SYMPTOMS   Hyperlipidemia    Hypertension    Hypothyroidism    Impaired fasting glucose 08/14/2020   Mild persistent asthma, uncomplicated 7/98/9211   Multinodular goiter 10/16/2011   Pneumonia    ABOUT FEB 2013   PONV (postoperative nausea and vomiting)    Shortness of breath dyspnea    OCCAS SOB   Thyroid disease    Thyroid nodule    Vitamin  D deficiency     Past Surgical History:  Procedure Laterality Date   ABDOMINAL HYSTERECTOMY     BUNIONECTOMY     RIGHT BIG TOE   CHOLECYSTECTOMY N/A 03/25/2014   Procedure: LAPAROSCOPIC CHOLECYSTECTOMY WITH INTRAOPERATIVE CHOLANGIOGRAM;  Surgeon: Armandina Gemma, MD;  Location: [facility];  Service: General;  Laterality: N/A;   EYE SURGERY     LASIK EYE SURGERY   TUBAL LIGATION      Current Medications: Current Meds  Medication Sig   albuterol (PROVENTIL HFA;VENTOLIN HFA) 108 (90 BASE) MCG/ACT inhaler Inhale 2 puffs into the lungs every 4 (four) hours as needed for wheezing. For breathing   aspirin 81 MG EC tablet Take 1 tablet by mouth daily.   cholecalciferol (VITAMIN D) 1000 UNITS tablet Take 1,000 Units by mouth daily.   fexofenadine (ALLEGRA) 180 MG tablet Take 180 mg by mouth daily.   fluticasone furoate-vilanterol (BREO ELLIPTA) 200-25 MCG/INH AEPB Inhale 1 puff into the lungs daily.   levothyroxine (SYNTHROID) 100 MCG tablet Take 100 mcg by mouth daily.   losartan (COZAAR) 100 MG tablet Take 1 tablet (100 mg total) by mouth daily.   metoprolol succinate (TOPROL-XL) 50 MG 24 hr tablet Take 1 tablet (50 mg total) by mouth daily. Take with or immediately following a meal.   Multiple Vitamin (MULTIVITAMIN PO) Take 1 tablet  by mouth daily. Unknown strength per patient   pravastatin (PRAVACHOL) 20 MG tablet Take 1 tablet (20 mg total) by mouth every evening.   RESTASIS 0.05 % ophthalmic emulsion Place 1 drop into both eyes daily.      Allergies:   Penicillins and Sulfa antibiotics   Social History   Socioeconomic History   Marital status: Married    Spouse name: Not on file   Number of children: Not on file   Years of education: Not on file   Highest education level: Not on file  Occupational History   Not on file  Tobacco Use   Smoking status: Never    Passive exposure: Never   Smokeless tobacco: Never  Vaping Use   Vaping Use: Never used  Substance and Sexual Activity    Alcohol use: No   Drug use: No   Sexual activity: Not Currently  Other Topics Concern   Not on file  Social History Narrative   Not on file   Social Determinants of Health   Financial Resource Strain: Not on file  Food Insecurity: Not on file  Transportation Needs: Not on file  Physical Activity: Not on file  Stress: Not on file  Social Connections: Not on file     Family History: The patient's family history includes Asthma in her mother; Breast cancer in her maternal grandmother; Emphysema in her maternal grandfather; Heart disease in her maternal grandfather; Hypertension in her mother; Irritable bowel syndrome in her brother, mother, and sister. ROS:   Please see the history of present illness.    All 14 point review of systems negative except as described per history of present illness  EKGs/Labs/Other Studies Reviewed:      Recent Labs: 10/09/2020: ALT 15 03/26/2021: BUN 14; Creatinine, Ser 0.82; Potassium 4.4; Sodium 139  Recent Lipid Panel    Component Value Date/Time   CHOL 155 10/09/2020 0813   TRIG 102 10/09/2020 0813   HDL 56 10/09/2020 0813   CHOLHDL 2.8 10/09/2020 0813   LDLCALC 80 10/09/2020 0813    Physical Exam:    VS:  BP (!) 154/92 (BP Location: Left Arm)    Pulse 72    Ht 5\' 7"  (1.702 m)    Wt 245 lb (111.1 kg)    SpO2 96%    BMI 38.37 kg/m     Wt Readings from Last 3 Encounters:  07/10/21 245 lb (111.1 kg)  03/26/21 243 lb (110.2 kg)  02/02/21 244 lb (110.7 kg)     GEN:  Well nourished, well developed in no acute distress HEENT: Normal NECK: No JVD; No carotid bruits LYMPHATICS: No lymphadenopathy CARDIAC: RRR, no murmurs, no rubs, no gallops RESPIRATORY:  Clear to auscultation without rales, wheezing or rhonchi  ABDOMEN: Soft, non-tender, non-distended MUSCULOSKELETAL:  No edema; No deformity  SKIN: Warm and dry LOWER EXTREMITIES: no swelling NEUROLOGIC:  Alert and oriented x 3 PSYCHIATRIC:  Normal affect   ASSESSMENT:    1.  Dilated cardiomyopathy (HCC) ejection fraction 45 to 50%   2. Coronary artery disease involving native coronary artery of native heart without angina pectoris   3. Essential hypertension   4. Atypical chest pain   5. Body mass index (BMI) 35.0-35.9, adult    PLAN:    In order of problems listed above:   Cardiomyopathy which is idiopathic/dilated.  Ejection fraction 40 to 45% now.  I will switch her from losartan to [facility].  We will check Chem-7 next week.  We will follow-up  with the regular basis to augment her medical therapy. Coronary artery disease nonobstructive based on coronary CT angio.  Continue aspirin continue statin. Essential hypertension: Blood pressure elevated hopefully better with addition of Entresto. Atypical chest pain denies having any. Body mass index elevated.  But she is working on it and trying to exercise and do an aggressive strengthening.  Continue   Medication Adjustments/Labs and Tests Ordered: Current medicines are reviewed at length with the patient today.  Concerns regarding medicines are outlined above.  No orders of the defined types were placed in this encounter.  Medication changes: No orders of the defined types were placed in this encounter.   Signed, Park Liter, MD, [facility] 07/10/2021 11:32 AM    [place]

## 2021-07-10 NOTE — Addendum Note (Signed)
Addended by: Edwyna Shell I on: 07/10/2021 11:56 AM   Modules accepted: Orders

## 2021-07-11 LAB — BASIC METABOLIC PANEL
BUN/Creatinine Ratio: 15 (ref 12–28)
BUN: 13 mg/dL (ref 8–27)
CO2: 24 mmol/L (ref 20–29)
Calcium: 10.2 mg/dL (ref 8.7–10.3)
Chloride: 102 mmol/L (ref 96–106)
Creatinine, Ser: 0.85 mg/dL (ref 0.57–1.00)
Glucose: 78 mg/dL (ref 70–99)
Potassium: 4.4 mmol/L (ref 3.5–5.2)
Sodium: 139 mmol/L (ref 134–144)
eGFR: 72 mL/min/{1.73_m2} (ref >59)

## 2021-08-01 DIAGNOSIS — E785 Hyperlipidemia, unspecified: Secondary | ICD-10-CM | POA: Diagnosis not present

## 2021-08-01 DIAGNOSIS — E559 Vitamin D deficiency, unspecified: Secondary | ICD-10-CM | POA: Diagnosis not present

## 2021-08-01 DIAGNOSIS — Z0000 Encounter for general adult medical examination without abnormal findings: Secondary | ICD-10-CM | POA: Diagnosis not present

## 2021-08-01 DIAGNOSIS — I10 Essential (primary) hypertension: Secondary | ICD-10-CM | POA: Diagnosis not present

## 2021-08-01 DIAGNOSIS — J4530 Mild persistent asthma, uncomplicated: Secondary | ICD-10-CM | POA: Diagnosis not present

## 2021-08-01 DIAGNOSIS — E039 Hypothyroidism, unspecified: Secondary | ICD-10-CM | POA: Diagnosis not present

## 2021-08-01 DIAGNOSIS — R7301 Impaired fasting glucose: Secondary | ICD-10-CM | POA: Diagnosis not present

## 2021-08-01 DIAGNOSIS — E042 Nontoxic multinodular goiter: Secondary | ICD-10-CM | POA: Diagnosis not present

## 2021-08-01 DIAGNOSIS — Z23 Encounter for immunization: Secondary | ICD-10-CM | POA: Diagnosis not present

## 2021-08-23 ENCOUNTER — Ambulatory Visit
Admission: RE | Admit: 2021-08-23 | Discharge: 2021-08-23 | Disposition: A | Discharge status: Home / Self Care | Payer: PPO | Source: Ambulatory Visit | Attending: Family Medicine | Admitting: Family Medicine

## 2021-08-23 DIAGNOSIS — N6011 Diffuse cystic mastopathy of right breast: Secondary | ICD-10-CM | POA: Diagnosis not present

## 2021-08-23 DIAGNOSIS — R928 Other abnormal and inconclusive findings on diagnostic imaging of breast: Secondary | ICD-10-CM

## 2021-10-08 ENCOUNTER — Other Ambulatory Visit: Payer: Self-pay

## 2021-10-08 MED ORDER — METOPROLOL SUCCINATE ER 50 MG PO TB24: 50.0000 mg | ORAL_TABLET | Freq: Every day | ORAL | 2 refills | Status: DC

## 2021-10-10 DIAGNOSIS — D239 Other benign neoplasm of skin, unspecified: Secondary | ICD-10-CM | POA: Diagnosis not present

## 2021-10-10 DIAGNOSIS — L820 Inflamed seborrheic keratosis: Secondary | ICD-10-CM | POA: Diagnosis not present

## 2021-10-10 DIAGNOSIS — D225 Melanocytic nevi of trunk: Secondary | ICD-10-CM | POA: Diagnosis not present

## 2021-10-10 DIAGNOSIS — L570 Actinic keratosis: Secondary | ICD-10-CM | POA: Diagnosis not present

## 2021-10-10 DIAGNOSIS — L814 Other melanin hyperpigmentation: Secondary | ICD-10-CM | POA: Diagnosis not present

## 2021-10-10 DIAGNOSIS — L578 Other skin changes due to chronic exposure to nonionizing radiation: Secondary | ICD-10-CM | POA: Diagnosis not present

## 2021-10-10 DIAGNOSIS — L821 Other seborrheic keratosis: Secondary | ICD-10-CM | POA: Diagnosis not present

## 2021-11-27 ENCOUNTER — Ambulatory Visit: Payer: PPO | Admitting: Cardiology

## 2021-11-27 ENCOUNTER — Encounter: Payer: Self-pay | Admitting: Cardiology

## 2021-11-27 VITALS — BP 114/72 | HR 63 | Ht 67.0 in | Wt 244.1 lb

## 2021-11-27 DIAGNOSIS — I10 Essential (primary) hypertension: Secondary | ICD-10-CM | POA: Diagnosis not present

## 2021-11-27 DIAGNOSIS — E785 Hyperlipidemia, unspecified: Secondary | ICD-10-CM

## 2021-11-27 DIAGNOSIS — R0609 Other forms of dyspnea: Secondary | ICD-10-CM | POA: Diagnosis not present

## 2021-11-27 DIAGNOSIS — I2510 Atherosclerotic heart disease of native coronary artery without angina pectoris: Secondary | ICD-10-CM

## 2021-11-27 DIAGNOSIS — M791 Myalgia, unspecified site: Secondary | ICD-10-CM

## 2021-11-27 DIAGNOSIS — I420 Dilated cardiomyopathy: Secondary | ICD-10-CM

## 2021-11-27 MED ORDER — EZETIMIBE 10 MG PO TABS: 10.0000 mg | ORAL_TABLET | Freq: Every day | ORAL | 3 refills | Status: AC

## 2021-11-27 NOTE — Patient Instructions (Signed)
Medication Instructions:  Your physician has recommended you make the following change in your medication: START: Zetia '10mg'$  1 tablet daily by mouth    Lab Work: [place] [address] Your physician recommends that you return for lab work in: 6 weeks You need to have labs done when you are fasting.  You can come Monday through Friday 8:30 am to 11:30am and 1:15 to 4:30. Thursday and Friday - Morning hours- ONLY-You do not need to make an appointment as the order has already been placed. The labs you are going to have done are Lipid, AST, ALT   Testing/Procedures: Your physician has requested that you have an echocardiogram. Echocardiography is a painless test that uses sound waves to create images of your heart. It provides your doctor with information about the size and shape of your heart and how well your heart's chambers and valves are working. This procedure takes approximately one hour. There are no restrictions for this procedure.    Follow-Up: At [facility], you and your health needs are our priority.  As part of our continuing mission to provide you with exceptional heart care, we have created designated Provider Care Teams.  These Care Teams include your primary Cardiologist (physician) and Advanced Practice Providers (APPs -  Physician Assistants and Nurse Practitioners) who all work together to provide you with the care you need, when you need it.  We recommend signing up for the patient portal called "MyChart".  Sign up information is provided on this After Visit Summary.  MyChart is used to connect with patients for Virtual Visits (Telemedicine).  Patients are able to view lab/test results, encounter notes, upcoming appointments, etc.  Non-urgent messages can be sent to your provider as well.   To learn more about what you can do with MyChart, go to [email or web address].    Your next appointment:   5 month(s)  The format for your next appointment:   In Person  Provider:    Jenne Campus, MD    Other Instructions NA

## 2021-11-27 NOTE — Progress Notes (Unsigned)
Cardiology Office Note:    Date:  11/27/2021   ID:  Lorraine Buckley, Lorraine Buckley 1945/06/12, MRN [id number]  PCP:  Lorraine Cruel, MD  Cardiologist:  Jenne Campus, MD    Referring MD: Lorraine Cruel, MD   Chief Complaint  Patient presents with   Medication Management    D/c pravastatin due to myalgia     History of Present Illness:    Lorraine Buckley is a 76 y.o. female with past medical history significant for nonischemic cardiomyopathy with ejection fraction 40 to 45%, she did have coronary CT angio which showed only 0 to 25% stenosis which obviously does not explain her cardiomyopathy.  Also dyslipidemia.  Today she was put on pravastatin however it created a lot of muscle aches and pravastatin has been discontinued.  Otherwise she is doing well.  She still playing and exercising at the gym.  She denies have any tightness squeezing pressure burning chest.  Shortness of breath is still there but manageable.  Past Medical History:  Diagnosis Date   Acute bronchitis 06/25/2011   Acute memory impairment    Altered mental status 10/08/2019   Amnesia    Arthritis    FINGERS AND NECK HURT   Asthma    ALLERGY INDUCED ASTHMA   Asthma 06/25/2011   Atypical chest pain 10/08/2019   Body mass index (BMI) 35.0-35.9, adult 08/14/2020   Cardiac arrhythmia 08/14/2020   Cholelithiasis with cholecystitis 03/21/2014   Decreased estrogen level 08/14/2020   Dilated cardiomyopathy (HCC) ejection fraction 45 to 50% 01/17/2020   Dyslipidemia 10/08/2019   Essential hypertension    Flu-like symptoms 06/25/2011   Gallstones    ABD PAIN, BAD GAS. FREQ STOOLS   Heart murmur    PT NOT SURE IF MURMUR STILL HEARD - DOES NOT CAUSE ANY SYMPTOMS   Hyperlipidemia    Hypertension    Hypothyroidism    Impaired fasting glucose 08/14/2020   Mild persistent asthma, uncomplicated 9/62/2297   Multinodular goiter 10/16/2011   Pneumonia    ABOUT FEB 2013   PONV (postoperative nausea and vomiting)    Shortness of  breath dyspnea    OCCAS SOB   Thyroid disease    Thyroid nodule    Vitamin D deficiency     Past Surgical History:  Procedure Laterality Date   ABDOMINAL HYSTERECTOMY     BUNIONECTOMY     RIGHT BIG TOE   CHOLECYSTECTOMY N/A 03/25/2014   Procedure: LAPAROSCOPIC CHOLECYSTECTOMY WITH INTRAOPERATIVE CHOLANGIOGRAM;  Surgeon: Armandina Gemma, MD;  Location: [facility];  Service: General;  Laterality: N/A;   EYE SURGERY     LASIK EYE SURGERY   TUBAL LIGATION      Current Medications: Current Meds  Medication Sig   albuterol (PROVENTIL HFA;VENTOLIN HFA) 108 (90 BASE) MCG/ACT inhaler Inhale 2 puffs into the lungs every 4 (four) hours as needed for wheezing. For breathing   aspirin 81 MG EC tablet Take 1 tablet by mouth daily.   cholecalciferol (VITAMIN D) 1000 UNITS tablet Take 1,000 Units by mouth daily.   Coenzyme Q10 (CO Q10 PO) Take 1 tablet by mouth in the morning.   fexofenadine (ALLEGRA) 180 MG tablet Take 180 mg by mouth daily.   fluticasone furoate-vilanterol (BREO ELLIPTA) 200-25 MCG/INH AEPB Inhale 1 puff into the lungs daily.   levothyroxine (SYNTHROID) 100 MCG tablet Take 100 mcg by mouth daily.   metoprolol succinate (TOPROL-XL) 50 MG 24 hr tablet Take 1 tablet (50 mg total) by mouth daily. Take with  or immediately following a meal.   Multiple Vitamin (MULTIVITAMIN PO) Take 1 tablet by mouth daily. Unknown strength per patient   RESTASIS 0.05 % ophthalmic emulsion Place 1 drop into both eyes daily.    sacubitril-valsartan (ENTRESTO) 24-26 MG Take 1 tablet by mouth 2 (two) times daily.   [DISCONTINUED] pravastatin (PRAVACHOL) 20 MG tablet Take 1 tablet (20 mg total) by mouth every evening.     Allergies:   Penicillins and Sulfa antibiotics   Social History   Socioeconomic History   Marital status: Married    Spouse name: Not on file   Number of children: Not on file   Years of education: Not on file   Highest education level: Not on file  Occupational History   Not on file   Tobacco Use   Smoking status: Never    Passive exposure: Never   Smokeless tobacco: Never  Vaping Use   Vaping Use: Never used  Substance and Sexual Activity   Alcohol use: No   Drug use: No   Sexual activity: Not Currently  Other Topics Concern   Not on file  Social History Narrative   Not on file   Social Determinants of Health   Financial Resource Strain: Not on file  Food Insecurity: Not on file  Transportation Needs: Not on file  Physical Activity: Not on file  Stress: Not on file  Social Connections: Not on file     Family History: The patient's family history includes Asthma in her mother; Breast cancer in her maternal grandmother; Emphysema in her maternal grandfather; Heart disease in her maternal grandfather; Hypertension in her mother; Irritable bowel syndrome in her brother, mother, and sister. ROS:   Please see the history of present illness.    All 14 point review of systems negative except as described per history of present illness  EKGs/Labs/Other Studies Reviewed:      Recent Labs: 07/10/2021: BUN 13; Creatinine, Ser 0.85; Potassium 4.4; Sodium 139  Recent Lipid Panel    Component Value Date/Time   CHOL 155 10/09/2020 0813   TRIG 102 10/09/2020 0813   HDL 56 10/09/2020 0813   CHOLHDL 2.8 10/09/2020 0813   LDLCALC 80 10/09/2020 0813    Physical Exam:    VS:  BP 114/72 (BP Location: Left Arm, Patient Position: Sitting)   Pulse 63   Ht '5\' 7"'$  (1.702 m)   Wt 244 lb 1.9 oz (110.7 kg)   SpO2 96%   BMI 38.23 kg/m     Wt Readings from Last 3 Encounters:  11/27/21 244 lb 1.9 oz (110.7 kg)  07/10/21 245 lb (111.1 kg)  03/26/21 243 lb (110.2 kg)     GEN:  Well nourished, well developed in no acute distress HEENT: Normal NECK: No JVD; No carotid bruits LYMPHATICS: No lymphadenopathy CARDIAC: RRR, no murmurs, no rubs, no gallops RESPIRATORY:  Clear to auscultation without rales, wheezing or rhonchi  ABDOMEN: Soft, non-tender,  non-distended MUSCULOSKELETAL:  No edema; No deformity  SKIN: Warm and dry LOWER EXTREMITIES: no swelling NEUROLOGIC:  Alert and oriented x 3 PSYCHIATRIC:  Normal affect   ASSESSMENT:    1. Dilated cardiomyopathy (HCC) ejection fraction 45 to 50%   2. Coronary artery disease involving native coronary artery of native heart without angina pectoris   3. Essential hypertension   4. Dyslipidemia    PLAN:    In order of problems listed above:  Cardiomyopathy She is on guideline directed medical therapy however we do have difficulty putting her  on appropriate medication because of the low blood pressure.  In December we will repeat echocardiogram to recheck left ventricle ejection fraction. Coronary disease only minimal.  She is on antiplatelet therapy which I will continue.  We tried a statin however patient had difficulty tolerating this I suggested to use Zetia.  We will send prescription as scheduled to get up and then we will check fasting lipid in 6 weeks Essential hypertension blood pressure well controlled continue present management. I did review K PN which only last a HDL of 60 LDL 91 however that was when she was taking    Medication Adjustments/Labs and Tests Ordered: Current medicines are reviewed at length with the patient today.  Concerns regarding medicines are outlined above.  No orders of the defined types were placed in this encounter.  Medication changes: No orders of the defined types were placed in this encounter.   Signed, Park Liter, MD, [facility] 11/27/2021 2:55 PM    [place]

## 2021-12-06 ENCOUNTER — Ambulatory Visit (HOSPITAL_BASED_OUTPATIENT_CLINIC_OR_DEPARTMENT_OTHER)
Admission: RE | Admit: 2021-12-06 | Discharge: 2021-12-06 | Disposition: A | Discharge status: Home / Self Care | Payer: PPO | Source: Ambulatory Visit | Attending: Cardiology | Admitting: Cardiology

## 2021-12-06 DIAGNOSIS — R0609 Other forms of dyspnea: Secondary | ICD-10-CM | POA: Diagnosis not present

## 2021-12-06 LAB — ECHOCARDIOGRAM COMPLETE
AR max vel: 1.36 cm2
AV Area VTI: 1.4 cm2
AV Area mean vel: 1.5 cm2
AV Mean grad: 6 mmHg
AV Peak grad: 11 mmHg
Ao pk vel: 1.66 m/s
Area-P 1/2: 2.1 cm2
Calc EF: 45.2 %
S' Lateral: 4.1 cm
Single Plane A2C EF: 50.5 %
Single Plane A4C EF: 40.7 %

## 2021-12-06 NOTE — Progress Notes (Signed)
  Echocardiogram 2D Echocardiogram has been performed.  Lorraine Buckley F 12/06/2021, 4:18 PM

## 2021-12-14 ENCOUNTER — Telehealth: Payer: Self-pay

## 2021-12-20 ENCOUNTER — Other Ambulatory Visit: Payer: Self-pay | Admitting: Family Medicine

## 2021-12-20 DIAGNOSIS — Z1231 Encounter for screening mammogram for malignant neoplasm of breast: Secondary | ICD-10-CM

## 2022-01-24 DIAGNOSIS — R5383 Other fatigue: Secondary | ICD-10-CM | POA: Diagnosis not present

## 2022-01-24 DIAGNOSIS — R509 Fever, unspecified: Secondary | ICD-10-CM | POA: Diagnosis not present

## 2022-01-24 DIAGNOSIS — Z03818 Encounter for observation for suspected exposure to other biological agents ruled out: Secondary | ICD-10-CM | POA: Diagnosis not present

## 2022-01-24 DIAGNOSIS — J0140 Acute pansinusitis, unspecified: Secondary | ICD-10-CM | POA: Diagnosis not present

## 2022-01-24 DIAGNOSIS — J189 Pneumonia, unspecified organism: Secondary | ICD-10-CM | POA: Diagnosis not present

## 2022-01-24 DIAGNOSIS — R519 Headache, unspecified: Secondary | ICD-10-CM | POA: Diagnosis not present

## 2022-02-04 ENCOUNTER — Ambulatory Visit
Admission: RE | Admit: 2022-02-04 | Discharge: 2022-02-04 | Disposition: A | Discharge status: Home / Self Care | Payer: PPO | Source: Ambulatory Visit | Attending: Family Medicine | Admitting: Family Medicine

## 2022-02-04 DIAGNOSIS — Z1231 Encounter for screening mammogram for malignant neoplasm of breast: Secondary | ICD-10-CM | POA: Diagnosis not present

## 2022-04-23 ENCOUNTER — Ambulatory Visit: Payer: PPO | Admitting: Cardiology

## 2022-05-22 ENCOUNTER — Ambulatory Visit: Payer: PPO | Attending: Cardiology | Admitting: Cardiology

## 2022-05-22 VITALS — BP 126/82 | HR 59 | Ht 67.0 in | Wt 250.1 lb

## 2022-05-22 DIAGNOSIS — R0789 Other chest pain: Secondary | ICD-10-CM

## 2022-05-22 DIAGNOSIS — I420 Dilated cardiomyopathy: Secondary | ICD-10-CM

## 2022-05-22 DIAGNOSIS — I2510 Atherosclerotic heart disease of native coronary artery without angina pectoris: Secondary | ICD-10-CM | POA: Diagnosis not present

## 2022-05-22 DIAGNOSIS — R0609 Other forms of dyspnea: Secondary | ICD-10-CM | POA: Diagnosis not present

## 2022-05-22 DIAGNOSIS — E785 Hyperlipidemia, unspecified: Secondary | ICD-10-CM

## 2022-05-22 NOTE — Patient Instructions (Signed)

## 2022-05-22 NOTE — Progress Notes (Unsigned)
Cardiology Office Note:    Date:  05/22/2022   ID:  Lorraine, Buckley [birth year], MRN [id number]  PCP:  Lawerance Cruel, MD  Cardiologist:  Jenne Campus, MD    Referring MD: Lawerance Cruel, MD   Chief Complaint  Patient presents with   Follow-up  Doing fine  History of Present Illness:    Lorraine Buckley is a 77 y.o. female with past medical history significant for nonischemic cardiomyopathy, listed assessment of left ventricle ejection fraction from summer with ejection fraction 45 to 50%, dyslipidemia, intolerant to multiple medications.  She comes today to my office for follow-up.  Overall doing well.  Denies have any chest pain tightness squeezing pressure burning chest swelling of lower extremities much better.  Past Medical History:  Diagnosis Date   Acute bronchitis 06/25/2011   Acute memory impairment    Altered mental status 10/08/2019   Amnesia    Arthritis    FINGERS AND NECK HURT   Asthma    ALLERGY INDUCED ASTHMA   Asthma 06/25/2011   Atypical chest pain 10/08/2019   Body mass index (BMI) 35.0-35.9, adult 08/14/2020   Cardiac arrhythmia 08/14/2020   Cholelithiasis with cholecystitis 03/21/2014   Decreased estrogen level 08/14/2020   Dilated cardiomyopathy (HCC) ejection fraction 45 to 50% 01/17/2020   Dyslipidemia 10/08/2019   Essential hypertension    Flu-like symptoms 06/25/2011   Gallstones    ABD PAIN, BAD GAS. FREQ STOOLS   Heart murmur    PT NOT SURE IF MURMUR STILL HEARD - DOES NOT CAUSE ANY SYMPTOMS   Hyperlipidemia    Hypertension    Hypothyroidism    Impaired fasting glucose 08/14/2020   Mild persistent asthma, uncomplicated 9/52/8413   Multinodular goiter 10/16/2011   Pneumonia    ABOUT FEB 2013   PONV (postoperative nausea and vomiting)    Shortness of breath dyspnea    OCCAS SOB   Thyroid disease    Thyroid nodule    Vitamin D deficiency     Past Surgical History:  Procedure Laterality Date   ABDOMINAL HYSTERECTOMY      BUNIONECTOMY     RIGHT BIG TOE   CHOLECYSTECTOMY N/A 03/25/2014   Procedure: LAPAROSCOPIC CHOLECYSTECTOMY WITH INTRAOPERATIVE CHOLANGIOGRAM;  Surgeon: Armandina Gemma, MD;  Location: [facility];  Service: General;  Laterality: N/A;   EYE SURGERY     LASIK EYE SURGERY   TUBAL LIGATION      Current Medications: Current Meds  Medication Sig   albuterol (PROVENTIL HFA;VENTOLIN HFA) 108 (90 BASE) MCG/ACT inhaler Inhale into the lungs every 4 (four) hours as needed for wheezing. For breathing   aspirin 81 MG EC tablet Take 1 tablet by mouth daily.   cholecalciferol (VITAMIN D) 1000 UNITS tablet Take 1,000 Units by mouth daily.   Coenzyme Q10 (CO Q10 PO) Take 1 tablet by mouth in the morning.   ezetimibe (ZETIA) 10 MG tablet Take 1 tablet (10 mg total) by mouth daily.   fexofenadine (ALLEGRA) 180 MG tablet Take 180 mg by mouth daily.   fluticasone furoate-vilanterol (BREO ELLIPTA) 200-25 MCG/INH AEPB Inhale 1 puff into the lungs daily.   levothyroxine (SYNTHROID) 100 MCG tablet Take 100 mcg by mouth daily.   metoprolol succinate (TOPROL-XL) 50 MG 24 hr tablet Take 1 tablet (50 mg total) by mouth daily. Take with or immediately following a meal.   Multiple Vitamin (MULTIVITAMIN PO) Take 1 tablet by mouth daily. Unknown strength per patient   RESTASIS 0.05 % ophthalmic emulsion  Place 1 drop into both eyes daily.    sacubitril-valsartan (ENTRESTO) 24-26 MG Take 1 tablet by mouth 2 (two) times daily.     Allergies:   Penicillins and Sulfa antibiotics   Social History   Socioeconomic History   Marital status: Married    Spouse name: Not on file   Number of children: Not on file   Years of education: Not on file   Highest education level: Not on file  Occupational History   Not on file  Tobacco Use   Smoking status: Never    Passive exposure: Never   Smokeless tobacco: Never  Vaping Use   Vaping Use: Never used  Substance and Sexual Activity   Alcohol use: No   Drug use: No   Sexual  activity: Not Currently  Other Topics Concern   Not on file  Social History Narrative   Not on file   Social Determinants of Health   Financial Resource Strain: Not on file  Food Insecurity: Not on file  Transportation Needs: Not on file  Physical Activity: Not on file  Stress: Not on file  Social Connections: Not on file     Family History: The patient's family history includes Asthma in her mother; Breast cancer in her maternal grandmother; Emphysema in her maternal grandfather; Heart disease in her maternal grandfather; Hypertension in her mother; Irritable bowel syndrome in her brother, mother, and sister. ROS:   Please see the history of present illness.    All 14 point review of systems negative except as described per history of present illness  EKGs/Labs/Other Studies Reviewed:      Recent Labs: 07/10/2021: BUN 13; Creatinine, Ser 0.85; Potassium 4.4; Sodium 139  Recent Lipid Panel    Component Value Date/Time   CHOL 155 10/09/2020 0813   TRIG 102 10/09/2020 0813   HDL 56 10/09/2020 0813   CHOLHDL 2.8 10/09/2020 0813   LDLCALC 80 10/09/2020 0813    Physical Exam:    VS:  BP 126/82 (BP Location: Left Arm, Patient Position: Sitting)   Pulse (!) 59   Ht '5\' 7"'$  (1.702 m)   Wt 250 lb 1.9 oz (113.5 kg)   SpO2 97%   BMI 39.17 kg/m     Wt Readings from Last 3 Encounters:  05/22/22 250 lb 1.9 oz (113.5 kg)  11/27/21 244 lb 1.9 oz (110.7 kg)  07/10/21 245 lb (111.1 kg)     GEN:  Well nourished, well developed in no acute distress HEENT: Normal NECK: No JVD; No carotid bruits LYMPHATICS: No lymphadenopathy CARDIAC: RRR, no murmurs, no rubs, no gallops RESPIRATORY:  Clear to auscultation without rales, wheezing or rhonchi  ABDOMEN: Soft, non-tender, non-distended MUSCULOSKELETAL:  No edema; No deformity  SKIN: Warm and dry LOWER EXTREMITIES: no swelling NEUROLOGIC:  Alert and oriented x 3 PSYCHIATRIC:  Normal affect   ASSESSMENT:    1. Coronary artery  disease involving native coronary artery of native heart without angina pectoris   2. Dilated cardiomyopathy (HCC) ejection fraction 45 to 50%   3. Atypical chest pain   4. Dyslipidemia    PLAN:    In order of problems listed above:  Nonischemic cardiomyopathy last estimation of the summer 1940 5 to 50%, she is on Entresto and Toprol XL.  Was unable to tolerate higher dosages.  I will repeat echocardiogram to recheck left ventricle ejection fraction.  Based on that we decide if we need to add some extra medication. Atypical chest pain denies having any.  Coronary CT angio showed only minimal irregularities. Dyslipidemia, I did review K PN which show me her LDL of 91 with HDL 60, I will recheck her fasting lipid profile.   Medication Adjustments/Labs and Tests Ordered: Current medicines are reviewed at length with the patient today.  Concerns regarding medicines are outlined above.  Orders Placed This Encounter  Procedures   EKG 12-Lead   Medication changes: No orders of the defined types were placed in this encounter.   Signed, Park Liter, MD, [facility] 05/22/2022 10:00 AM    [place]

## 2022-06-14 ENCOUNTER — Other Ambulatory Visit: Payer: Self-pay | Admitting: Cardiology

## 2022-06-14 NOTE — Telephone Encounter (Signed)
Rx refill sent to pharmacy. 

## 2022-07-04 ENCOUNTER — Ambulatory Visit (HOSPITAL_BASED_OUTPATIENT_CLINIC_OR_DEPARTMENT_OTHER)
Admission: RE | Admit: 2022-07-04 | Discharge: 2022-07-04 | Disposition: A | Discharge status: Home / Self Care | Payer: PPO | Source: Ambulatory Visit | Attending: Cardiology | Admitting: Cardiology

## 2022-07-04 DIAGNOSIS — R0609 Other forms of dyspnea: Secondary | ICD-10-CM | POA: Diagnosis not present

## 2022-07-04 LAB — ECHOCARDIOGRAM COMPLETE
Area-P 1/2: 3.31 cm2
Calc EF: 53.4 %
MV M vel: 4.92 m/s
MV Peak grad: 96.8 mmHg
S' Lateral: 3.7 cm
Single Plane A2C EF: 57.5 %
Single Plane A4C EF: 50.6 %

## 2022-07-11 ENCOUNTER — Telehealth: Payer: Self-pay

## 2022-07-11 NOTE — Telephone Encounter (Signed)
LVM and My Chart message per DPR- Per Dr. Wendy Poet note. Encouraged pt call with any questions. Routed to PCP.

## 2022-07-31 DIAGNOSIS — H40013 Open angle with borderline findings, low risk, bilateral: Secondary | ICD-10-CM | POA: Diagnosis not present

## 2022-08-05 DIAGNOSIS — I10 Essential (primary) hypertension: Secondary | ICD-10-CM | POA: Diagnosis not present

## 2022-08-05 DIAGNOSIS — E559 Vitamin D deficiency, unspecified: Secondary | ICD-10-CM | POA: Diagnosis not present

## 2022-08-05 DIAGNOSIS — E785 Hyperlipidemia, unspecified: Secondary | ICD-10-CM | POA: Diagnosis not present

## 2022-08-05 DIAGNOSIS — R7301 Impaired fasting glucose: Secondary | ICD-10-CM | POA: Diagnosis not present

## 2022-08-05 DIAGNOSIS — E039 Hypothyroidism, unspecified: Secondary | ICD-10-CM | POA: Diagnosis not present

## 2022-08-07 DIAGNOSIS — I10 Essential (primary) hypertension: Secondary | ICD-10-CM | POA: Diagnosis not present

## 2022-08-07 DIAGNOSIS — E039 Hypothyroidism, unspecified: Secondary | ICD-10-CM | POA: Diagnosis not present

## 2022-08-07 DIAGNOSIS — J4530 Mild persistent asthma, uncomplicated: Secondary | ICD-10-CM | POA: Diagnosis not present

## 2022-08-07 DIAGNOSIS — Z0000 Encounter for general adult medical examination without abnormal findings: Secondary | ICD-10-CM | POA: Diagnosis not present

## 2022-08-07 DIAGNOSIS — E559 Vitamin D deficiency, unspecified: Secondary | ICD-10-CM | POA: Diagnosis not present

## 2022-08-07 DIAGNOSIS — I420 Dilated cardiomyopathy: Secondary | ICD-10-CM | POA: Diagnosis not present

## 2022-08-07 DIAGNOSIS — E785 Hyperlipidemia, unspecified: Secondary | ICD-10-CM | POA: Diagnosis not present

## 2022-08-07 DIAGNOSIS — R7303 Prediabetes: Secondary | ICD-10-CM | POA: Diagnosis not present

## 2022-08-07 DIAGNOSIS — Z6841 Body Mass Index (BMI) 40.0 and over, adult: Secondary | ICD-10-CM | POA: Diagnosis not present

## 2022-08-07 DIAGNOSIS — G720 Drug-induced myopathy: Secondary | ICD-10-CM | POA: Diagnosis not present

## 2022-10-22 DIAGNOSIS — L578 Other skin changes due to chronic exposure to nonionizing radiation: Secondary | ICD-10-CM | POA: Diagnosis not present

## 2022-10-22 DIAGNOSIS — L814 Other melanin hyperpigmentation: Secondary | ICD-10-CM | POA: Diagnosis not present

## 2022-10-22 DIAGNOSIS — D239 Other benign neoplasm of skin, unspecified: Secondary | ICD-10-CM | POA: Diagnosis not present

## 2022-10-22 DIAGNOSIS — L821 Other seborrheic keratosis: Secondary | ICD-10-CM | POA: Diagnosis not present

## 2022-10-22 DIAGNOSIS — D225 Melanocytic nevi of trunk: Secondary | ICD-10-CM | POA: Diagnosis not present

## 2022-11-05 ENCOUNTER — Encounter: Payer: Self-pay | Admitting: Cardiology

## 2022-11-05 ENCOUNTER — Ambulatory Visit: Payer: PPO | Attending: Cardiology | Admitting: Cardiology

## 2022-11-05 VITALS — BP 132/74 | HR 53 | Ht 67.0 in | Wt 248.0 lb

## 2022-11-05 DIAGNOSIS — I420 Dilated cardiomyopathy: Secondary | ICD-10-CM

## 2022-11-05 DIAGNOSIS — E785 Hyperlipidemia, unspecified: Secondary | ICD-10-CM | POA: Diagnosis not present

## 2022-11-05 DIAGNOSIS — I2510 Atherosclerotic heart disease of native coronary artery without angina pectoris: Secondary | ICD-10-CM | POA: Diagnosis not present

## 2022-11-05 DIAGNOSIS — R7303 Prediabetes: Secondary | ICD-10-CM | POA: Insufficient documentation

## 2022-11-05 DIAGNOSIS — G720 Drug-induced myopathy: Secondary | ICD-10-CM | POA: Insufficient documentation

## 2022-11-05 DIAGNOSIS — I10 Essential (primary) hypertension: Secondary | ICD-10-CM | POA: Diagnosis not present

## 2022-11-05 MED ORDER — PRAVASTATIN SODIUM 20 MG PO TABS: 20.0000 mg | ORAL_TABLET | Freq: Every evening | ORAL | 3 refills | Status: DC

## 2022-11-05 NOTE — Patient Instructions (Signed)
Medication Instructions:  Your physician has recommended you make the following change in your medication: Start Pravastatin 20 mg daily.  *If you need a refill on your cardiac medications before your next appointment, please call your pharmacy*   Lab Work: Your physician recommends that you return for lab work in: Lipid profile, AST and ALT in 6 weeks fasting If you have labs (blood work) drawn today and your tests are completely normal, you will receive your results only by: MyChart Message (if you have MyChart) OR A paper copy in the mail If you have any lab test that is abnormal or we need to change your treatment, we will call you to review the results.   Testing/Procedures: None   Follow-Up: At [facility], you and your health needs are our priority.  As part of our continuing mission to provide you with exceptional heart care, we have created designated Provider Care Teams.  These Care Teams include your primary Cardiologist (physician) and Advanced Practice Providers (APPs -  Physician Assistants and Nurse Practitioners) who all work together to provide you with the care you need, when you need it.  We recommend signing up for the patient portal called "MyChart".  Sign up information is provided on this After Visit Summary.  MyChart is used to connect with patients for Virtual Visits (Telemedicine).  Patients are able to view lab/test results, encounter notes, upcoming appointments, etc.  Non-urgent messages can be sent to your provider as well.   To learn more about what you can do with MyChart, go to [email or web address].    Your next appointment:   1 year(s)  Provider:   Gypsy Balsam, MD    Other Instructions

## 2022-11-05 NOTE — Progress Notes (Signed)
Cardiology Office Note:    Date:  11/05/2022   ID:  Lorraine Buckley, Lorraine Buckley 1945/11/11, MRN [id number]  PCP:  Daisy Floro, MD  Cardiologist:  Gypsy Balsam, MD    Referring MD: Daisy Floro, MD   Chief Complaint  Patient presents with   Follow-up    History of Present Illness:    Lorraine Buckley is a 77 y.o. 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  Dyslipidemia with intolerance to multiple medications, essential hypertension.  She comes today to months for follow-up.  Overall doing very well.  She denies have any chest pain tightness squeezing pressure burning chest.  Past Medical History:  Diagnosis Date   Acute bronchitis 06/25/2011   Acute memory impairment    Altered mental status 10/08/2019   Amnesia    Arthritis    FINGERS AND NECK HURT   Asthma    ALLERGY INDUCED ASTHMA   Asthma 06/25/2011   Atypical chest pain 10/08/2019   Body mass index (BMI) 35.0-35.9, adult 08/14/2020   Cardiac arrhythmia 08/14/2020   Cholelithiasis with cholecystitis 03/21/2014   Decreased estrogen level 08/14/2020   Dilated cardiomyopathy (HCC) ejection fraction 45 to 50% 01/17/2020   Dyslipidemia 10/08/2019   Essential hypertension    Flu-like symptoms 06/25/2011   Gallstones    ABD PAIN, BAD GAS. FREQ STOOLS   Heart murmur    PT NOT SURE IF MURMUR STILL HEARD - DOES NOT CAUSE ANY SYMPTOMS   Hyperlipidemia    Hypertension    Hypothyroidism    Impaired fasting glucose 08/14/2020   Mild persistent asthma, uncomplicated 08/14/2020   Multinodular goiter 10/16/2011   Pneumonia    ABOUT FEB 2013   PONV (postoperative nausea and vomiting)    Shortness of breath dyspnea    OCCAS SOB   Thyroid disease    Thyroid nodule    Vitamin D deficiency     Past Surgical History:  Procedure Laterality Date   ABDOMINAL HYSTERECTOMY     BUNIONECTOMY      RIGHT BIG TOE   CHOLECYSTECTOMY N/A 03/25/2014   Procedure: LAPAROSCOPIC CHOLECYSTECTOMY WITH INTRAOPERATIVE CHOLANGIOGRAM;  Surgeon: Darnell Level, MD;  Location: [facility];  Service: General;  Laterality: N/A;   EYE SURGERY     LASIK EYE SURGERY   TUBAL LIGATION      Current Medications: Current Meds  Medication Sig   albuterol (PROVENTIL HFA;VENTOLIN HFA) 108 (90 BASE) MCG/ACT inhaler Inhale 1-2 puffs into the lungs every 4 (four) hours as needed for wheezing. For breathing   aspirin 81 MG EC tablet Take 1 tablet by mouth daily.   cholecalciferol (VITAMIN D) 1000 UNITS tablet Take 1,000 Units by mouth daily.   Coenzyme Q10 (CO Q10 PO) Take 1 tablet by mouth in the morning.   ezetimibe (ZETIA) 10 MG tablet Take 1 tablet (10 mg total) by mouth daily.   fexofenadine (ALLEGRA) 180 MG tablet Take 180 mg by mouth daily.   fluticasone furoate-vilanterol (BREO ELLIPTA) 200-25 MCG/INH AEPB Inhale 1 puff into the lungs daily.   levothyroxine (SYNTHROID) 100 MCG tablet Take 100 mcg by mouth daily.   Multiple Vitamin (MULTIVITAMIN PO) Take 1 tablet by mouth daily. Unknown strength per patient   RESTASIS 0.05 % ophthalmic emulsion Place 1 drop into both eyes daily.    sacubitril-valsartan (ENTRESTO) 24-26 MG Take 1 tablet by mouth twice daily     Allergies:  Penicillins and Sulfa antibiotics   Social History   Socioeconomic History   Marital status: Married    Spouse name: Not on file   Number of children: Not on file   Years of education: Not on file   Highest education level: Not on file  Occupational History   Not on file  Tobacco Use   Smoking status: Never    Passive exposure: Never   Smokeless tobacco: Never  Vaping Use   Vaping Use: Never used  Substance and Sexual Activity   Alcohol use: No   Drug use: No   Sexual activity: Not Currently  Other Topics Concern   Not on file  Social History Narrative   Not on file   Social Determinants of Health   Financial Resource  Strain: Not on file  Food Insecurity: Not on file  Transportation Needs: Not on file  Physical Activity: Not on file  Stress: Not on file  Social Connections: Not on file     Family History: The patient's family history includes Asthma in her mother; Breast cancer in her maternal grandmother; Emphysema in her maternal grandfather; Heart disease in her maternal grandfather; Hypertension in her mother; Irritable bowel syndrome in her brother, mother, and sister. ROS:   Please see the history of present illness.    All 14 point review of systems negative except as described per history of present illness  EKGs/Labs/Other Studies Reviewed:      Recent Labs: No results found for requested labs within last 365 days.  Recent Lipid Panel    Component Value Date/Time   CHOL 155 10/09/2020 0813   TRIG 102 10/09/2020 0813   HDL 56 10/09/2020 0813   CHOLHDL 2.8 10/09/2020 0813   LDLCALC 80 10/09/2020 0813    Physical Exam:    VS:  BP 132/74 (BP Location: Left Arm, Patient Position: Sitting)   Pulse (!) 53   Ht 5\' 7"  (1.702 m)   Wt 248 lb (112.5 kg)   SpO2 96%   BMI 38.84 kg/m     Wt Readings from Last 3 Encounters:  11/05/22 248 lb (112.5 kg)  05/22/22 250 lb 1.9 oz (113.5 kg)  11/27/21 244 lb 1.9 oz (110.7 kg)     GEN:  Well nourished, well developed in no acute distress HEENT: Normal NECK: No JVD; No carotid bruits LYMPHATICS: No lymphadenopathy CARDIAC: RRR, no murmurs, no rubs, no gallops RESPIRATORY:  Clear to auscultation without rales, wheezing or rhonchi  ABDOMEN: Soft, non-tender, non-distended MUSCULOSKELETAL:  No edema; No deformity  SKIN: Warm and dry LOWER EXTREMITIES: no swelling NEUROLOGIC:  Alert and oriented x 3 PSYCHIATRIC:  Normal affect   ASSESSMENT:    1. Dilated cardiomyopathy (HCC) ejection fraction 45 to 50%   2. Coronary artery disease involving native coronary artery of native heart without angina pectoris   3. Essential hypertension   4.  Dyslipidemia    PLAN:    In order of problems listed above:  Dilated cardiomyopathy normalization of ejection fraction based on last echocardiogram we will continue Entresto and metoprolol. Coronary disease only minimal decrease risk factors modification she is on antiplatelet therapy will try to reduce her cholesterol. Dyslipidemia did review K PN which show me data from August 05, 2022 with LDL of 128 HDL 55.  She agreed to try pravastatin 20 which we will do in the past and lipid profile will be done within next few weeks.  Medication Adjustments/Labs and Tests Ordered: Current medicines are reviewed at length  with the patient today.  Concerns regarding medicines are outlined above.  No orders of the defined types were placed in this encounter.  Medication changes: No orders of the defined types were placed in this encounter.   Signed, Georgeanna Lea, MD, [facility] 11/05/2022 11:42 AM    [facility]

## 2022-11-15 ENCOUNTER — Other Ambulatory Visit: Payer: Self-pay | Admitting: Cardiology

## 2022-11-15 MED ORDER — METOPROLOL SUCCINATE ER 50 MG PO TB24: 50.0000 mg | ORAL_TABLET | Freq: Every day | ORAL | 2 refills | Status: DC

## 2022-12-24 ENCOUNTER — Other Ambulatory Visit: Payer: Self-pay | Admitting: Family Medicine

## 2022-12-24 DIAGNOSIS — Z1231 Encounter for screening mammogram for malignant neoplasm of breast: Secondary | ICD-10-CM

## 2023-01-21 DIAGNOSIS — I10 Essential (primary) hypertension: Secondary | ICD-10-CM | POA: Diagnosis not present

## 2023-01-21 DIAGNOSIS — E785 Hyperlipidemia, unspecified: Secondary | ICD-10-CM | POA: Diagnosis not present

## 2023-01-21 DIAGNOSIS — I2510 Atherosclerotic heart disease of native coronary artery without angina pectoris: Secondary | ICD-10-CM | POA: Diagnosis not present

## 2023-01-21 DIAGNOSIS — I420 Dilated cardiomyopathy: Secondary | ICD-10-CM | POA: Diagnosis not present

## 2023-01-24 ENCOUNTER — Telehealth: Payer: Self-pay

## 2023-01-24 DIAGNOSIS — E785 Hyperlipidemia, unspecified: Secondary | ICD-10-CM

## 2023-01-24 MED ORDER — PRAVASTATIN SODIUM 40 MG PO TABS: 40.0000 mg | ORAL_TABLET | Freq: Every evening | ORAL | 3 refills | Status: DC

## 2023-01-24 NOTE — Telephone Encounter (Signed)
-----   Message from Gypsy Balsam sent at 01/23/2023  8:18 AM EDT ----- Fasting lipid profile show LDL 104 ideally need to be less than 100.  If she is taking pravastatin 20 we need to increase it pravastatin 40 and fasting lipid profile is TLT need to be done

## 2023-01-24 NOTE — Telephone Encounter (Signed)
MyChart 9/6

## 2023-02-06 ENCOUNTER — Ambulatory Visit
Admission: RE | Admit: 2023-02-06 | Discharge: 2023-02-06 | Disposition: A | Discharge status: Home / Self Care | Payer: PPO | Source: Ambulatory Visit | Attending: Family Medicine | Admitting: Family Medicine

## 2023-02-06 DIAGNOSIS — Z1231 Encounter for screening mammogram for malignant neoplasm of breast: Secondary | ICD-10-CM

## 2023-02-10 DIAGNOSIS — R7303 Prediabetes: Secondary | ICD-10-CM | POA: Diagnosis not present

## 2023-02-19 DIAGNOSIS — R7303 Prediabetes: Secondary | ICD-10-CM | POA: Diagnosis not present

## 2023-02-19 DIAGNOSIS — J4530 Mild persistent asthma, uncomplicated: Secondary | ICD-10-CM | POA: Diagnosis not present

## 2023-03-20 IMAGING — MG MM DIGITAL SCREENING BILAT W/ TOMO AND CAD
6 of 12 series · 6 of 36 positions shown · non-contrast
Comparison: Previous exam(s).

CLINICAL DATA: Screening.

EXAM:
DIGITAL SCREENING BILATERAL MAMMOGRAM WITH TOMOSYNTHESIS AND CAD
TECHNIQUE: Bilateral screening digital craniocaudal and mediolateral oblique
mammograms were obtained. Bilateral screening digital breast
tomosynthesis was performed. The images were evaluated with
computer-aided detection.

[R CC synth-2D (1 of 2)]
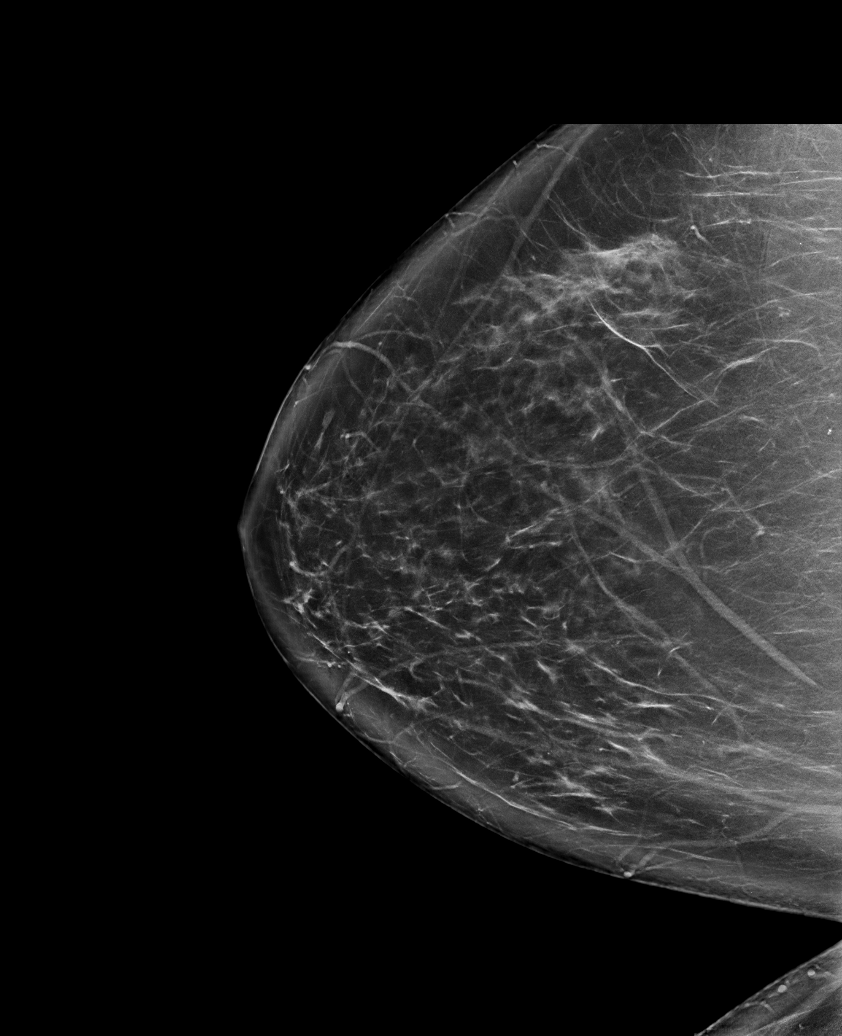

[L MLO synth-2D]
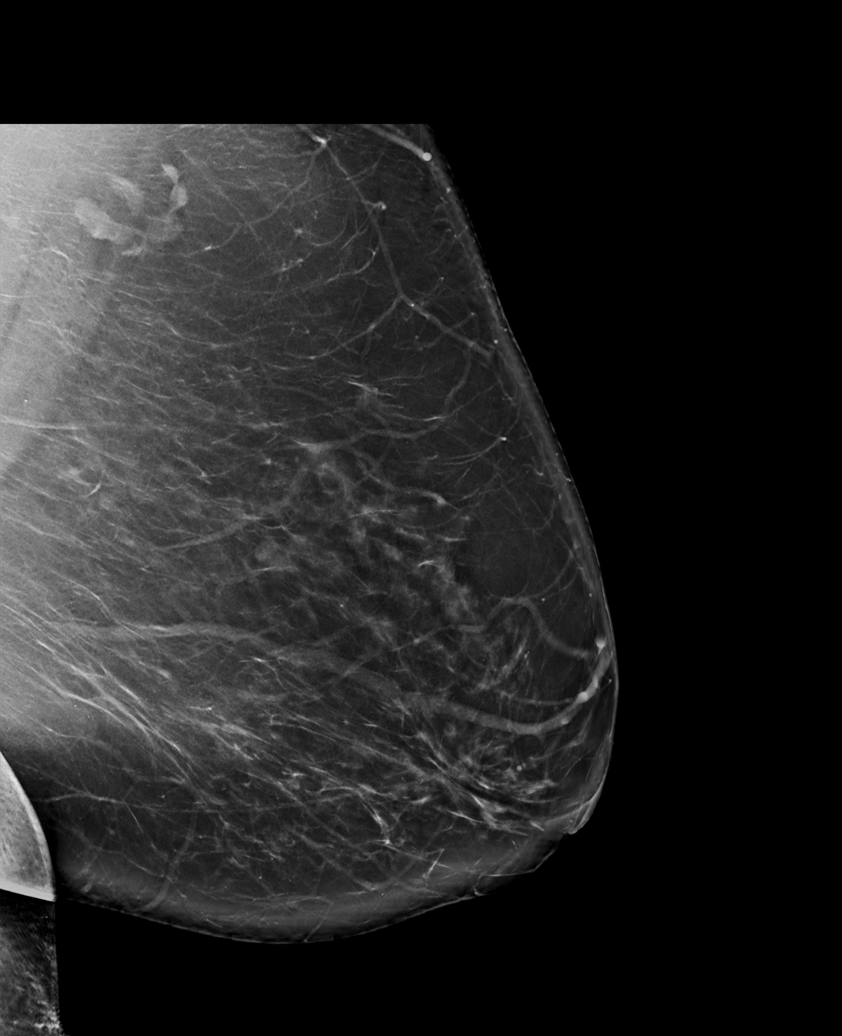

[R MLO synth-2D]
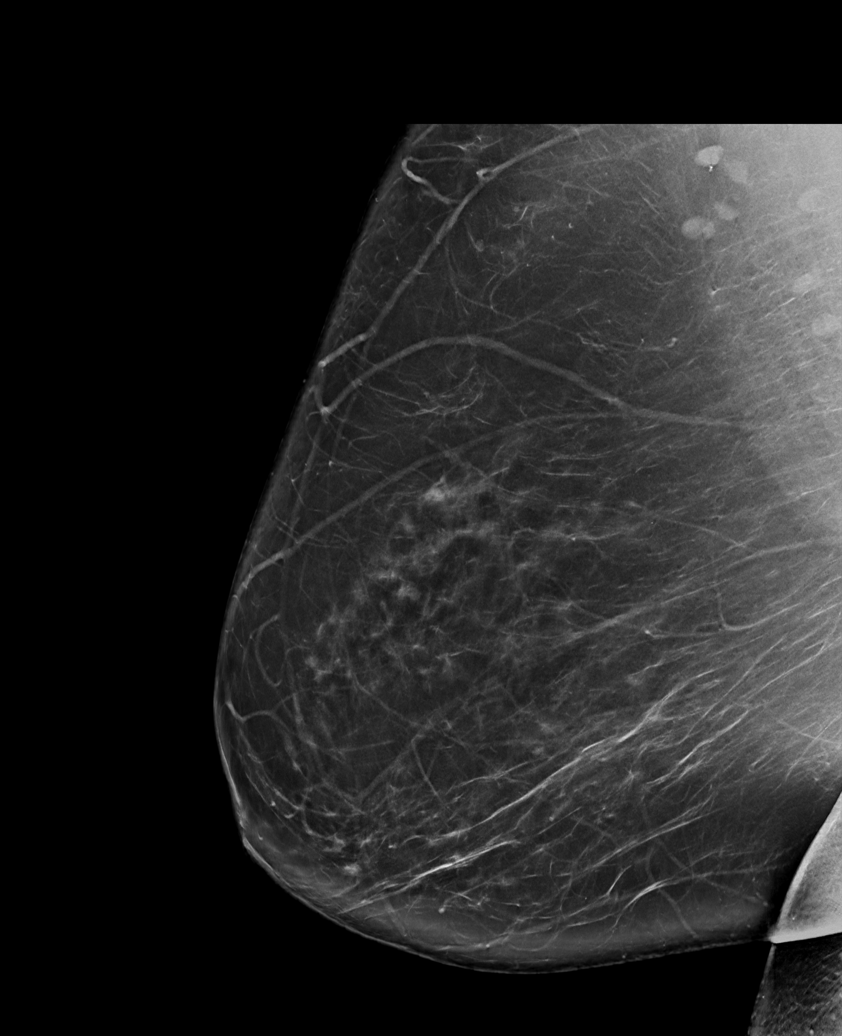

[L CC synth-2D (1 of 2)]
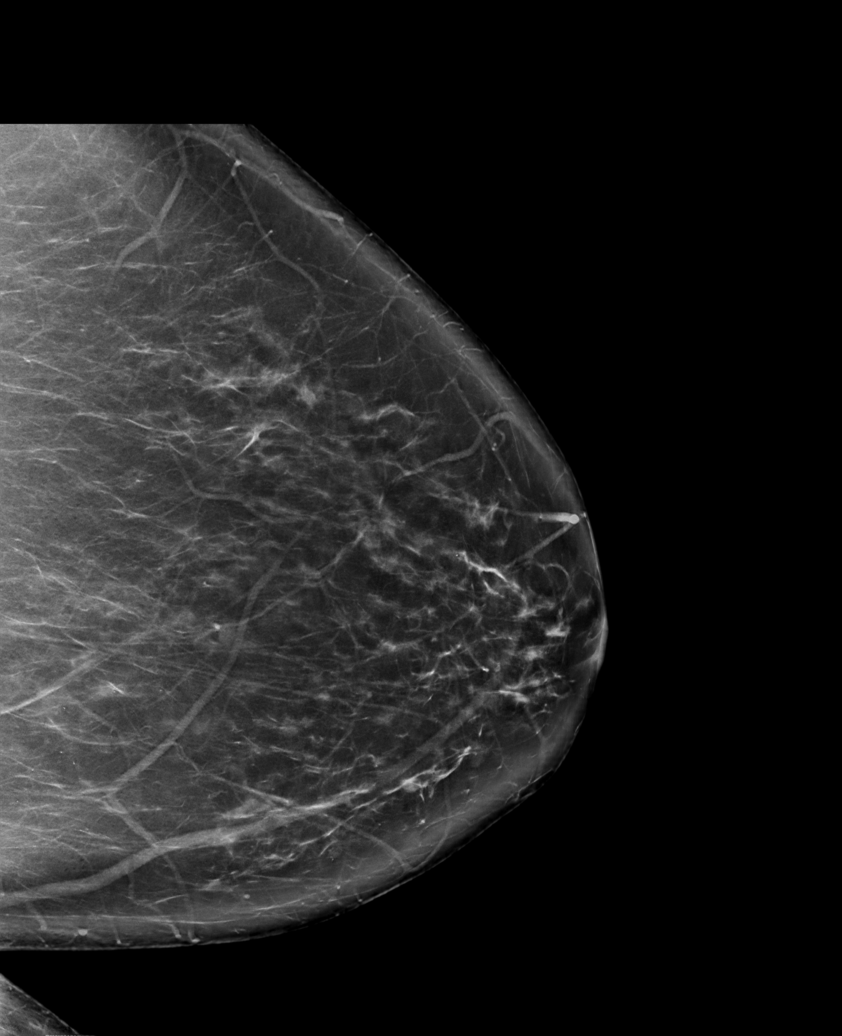

[L CC synth-2D (2 of 2)]
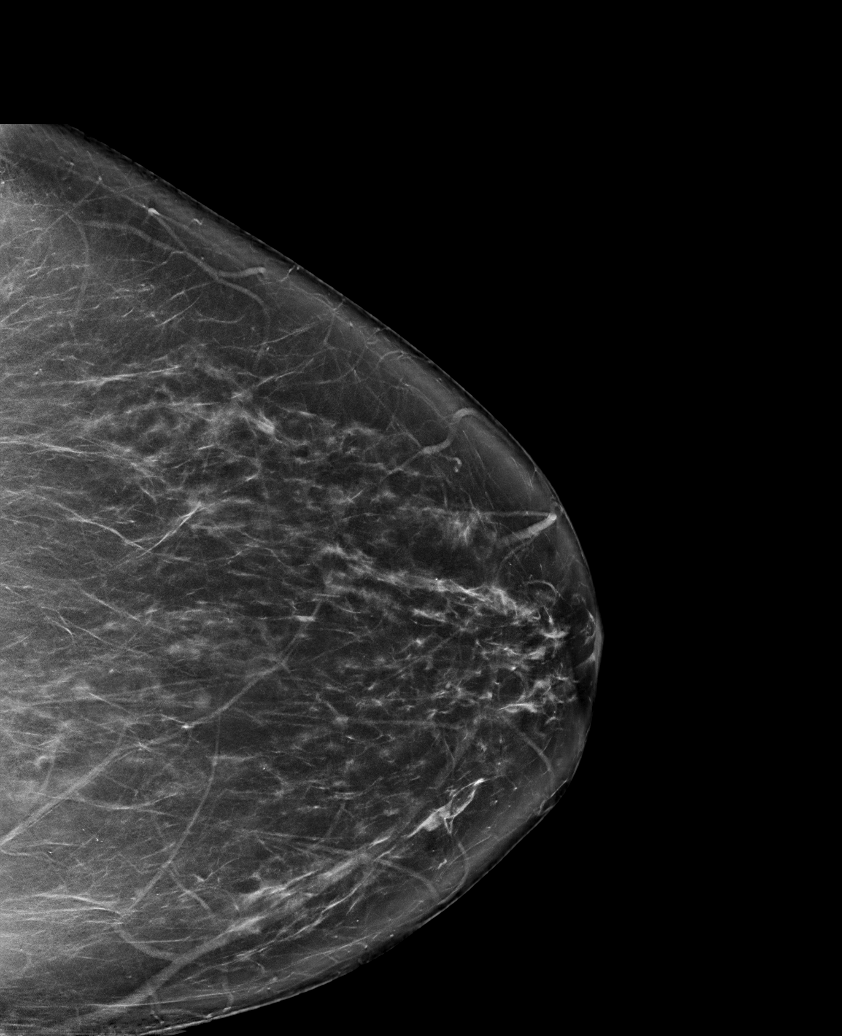

[R CC synth-2D (2 of 2)]
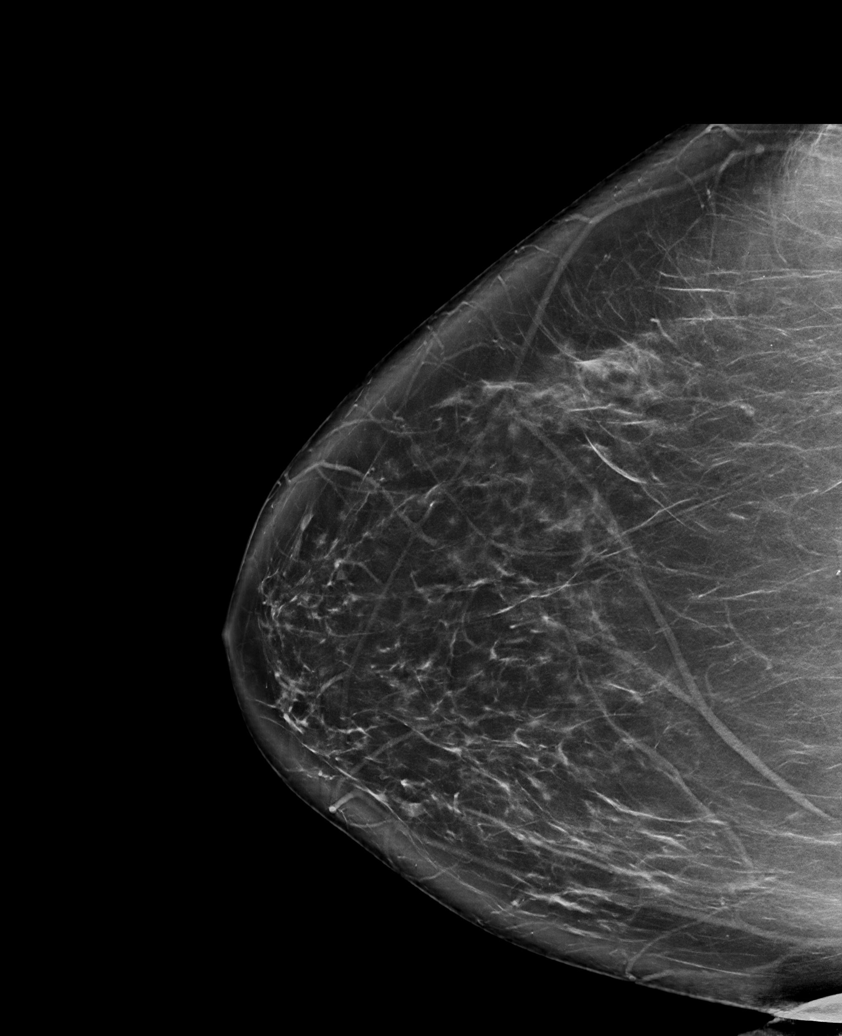

[6 of 36 positions shown; findings below may reference images not displayed]

ACR Breast Density Category b: There are scattered areas of
fibroglandular density.
FINDINGS: In the right breast, a possible mass warrants further evaluation. In
the left breast, no findings suspicious for malignancy.
IMPRESSION: Further evaluation is suggested for a possible mass in the right
breast.

RECOMMENDATION:
Diagnostic mammogram and possibly ultrasound of the right breast.
(Code:[id number]-[id number])

The patient will be contacted regarding the findings, and additional
imaging will be scheduled.

BI-RADS CATEGORY  0: Incomplete. Need additional imaging evaluation
and/or prior mammograms for comparison.

## 2023-05-17 DIAGNOSIS — J018 Other acute sinusitis: Secondary | ICD-10-CM | POA: Diagnosis not present

## 2023-06-16 ENCOUNTER — Other Ambulatory Visit: Payer: Self-pay | Admitting: Cardiology

## 2023-06-16 NOTE — Telephone Encounter (Signed)
Rx refill sent to pharmacy.

## 2023-07-23 ENCOUNTER — Other Ambulatory Visit: Payer: Self-pay | Admitting: Cardiology

## 2023-08-04 DIAGNOSIS — H40013 Open angle with borderline findings, low risk, bilateral: Secondary | ICD-10-CM | POA: Diagnosis not present

## 2023-08-14 DIAGNOSIS — Z0000 Encounter for general adult medical examination without abnormal findings: Secondary | ICD-10-CM | POA: Diagnosis not present

## 2023-08-14 DIAGNOSIS — I10 Essential (primary) hypertension: Secondary | ICD-10-CM | POA: Diagnosis not present

## 2023-08-14 DIAGNOSIS — E039 Hypothyroidism, unspecified: Secondary | ICD-10-CM | POA: Diagnosis not present

## 2023-08-14 DIAGNOSIS — E785 Hyperlipidemia, unspecified: Secondary | ICD-10-CM | POA: Diagnosis not present

## 2023-08-14 DIAGNOSIS — R7303 Prediabetes: Secondary | ICD-10-CM | POA: Diagnosis not present

## 2023-08-14 DIAGNOSIS — E559 Vitamin D deficiency, unspecified: Secondary | ICD-10-CM | POA: Diagnosis not present

## 2023-08-18 DIAGNOSIS — Z23 Encounter for immunization: Secondary | ICD-10-CM | POA: Diagnosis not present

## 2023-08-18 DIAGNOSIS — E559 Vitamin D deficiency, unspecified: Secondary | ICD-10-CM | POA: Diagnosis not present

## 2023-08-18 DIAGNOSIS — E119 Type 2 diabetes mellitus without complications: Secondary | ICD-10-CM | POA: Diagnosis not present

## 2023-08-18 DIAGNOSIS — J4530 Mild persistent asthma, uncomplicated: Secondary | ICD-10-CM | POA: Diagnosis not present

## 2023-08-18 DIAGNOSIS — E042 Nontoxic multinodular goiter: Secondary | ICD-10-CM | POA: Diagnosis not present

## 2023-08-18 DIAGNOSIS — Z0000 Encounter for general adult medical examination without abnormal findings: Secondary | ICD-10-CM | POA: Diagnosis not present

## 2023-08-18 DIAGNOSIS — E039 Hypothyroidism, unspecified: Secondary | ICD-10-CM | POA: Diagnosis not present

## 2023-08-18 DIAGNOSIS — I10 Essential (primary) hypertension: Secondary | ICD-10-CM | POA: Diagnosis not present

## 2023-08-18 DIAGNOSIS — Z6841 Body Mass Index (BMI) 40.0 and over, adult: Secondary | ICD-10-CM | POA: Diagnosis not present

## 2023-08-18 DIAGNOSIS — E785 Hyperlipidemia, unspecified: Secondary | ICD-10-CM | POA: Diagnosis not present

## 2023-10-20 ENCOUNTER — Other Ambulatory Visit: Payer: Self-pay | Admitting: Cardiology

## 2023-11-04 DIAGNOSIS — D239 Other benign neoplasm of skin, unspecified: Secondary | ICD-10-CM | POA: Diagnosis not present

## 2023-11-04 DIAGNOSIS — D225 Melanocytic nevi of trunk: Secondary | ICD-10-CM | POA: Diagnosis not present

## 2023-11-04 DIAGNOSIS — L719 Rosacea, unspecified: Secondary | ICD-10-CM | POA: Diagnosis not present

## 2023-11-04 DIAGNOSIS — L570 Actinic keratosis: Secondary | ICD-10-CM | POA: Diagnosis not present

## 2023-11-04 DIAGNOSIS — L814 Other melanin hyperpigmentation: Secondary | ICD-10-CM | POA: Diagnosis not present

## 2023-11-04 DIAGNOSIS — L821 Other seborrheic keratosis: Secondary | ICD-10-CM | POA: Diagnosis not present

## 2023-11-04 DIAGNOSIS — L578 Other skin changes due to chronic exposure to nonionizing radiation: Secondary | ICD-10-CM | POA: Diagnosis not present

## 2023-12-11 DIAGNOSIS — L719 Rosacea, unspecified: Secondary | ICD-10-CM | POA: Insufficient documentation

## 2023-12-11 DIAGNOSIS — D1801 Hemangioma of skin and subcutaneous tissue: Secondary | ICD-10-CM | POA: Insufficient documentation

## 2023-12-15 ENCOUNTER — Ambulatory Visit (HOSPITAL_BASED_OUTPATIENT_CLINIC_OR_DEPARTMENT_OTHER)
Admission: RE | Admit: 2023-12-15 | Discharge: 2023-12-15 | Disposition: A | Discharge status: Home / Self Care | Source: Ambulatory Visit | Attending: Cardiology | Admitting: Cardiology

## 2023-12-15 ENCOUNTER — Ambulatory Visit: Attending: Cardiology | Admitting: Cardiology

## 2023-12-15 ENCOUNTER — Ambulatory Visit: Admitting: Cardiology

## 2023-12-15 ENCOUNTER — Encounter: Payer: Self-pay | Admitting: Cardiology

## 2023-12-15 VITALS — BP 132/84 | HR 65 | Ht 67.0 in | Wt 248.8 lb

## 2023-12-15 DIAGNOSIS — I420 Dilated cardiomyopathy: Secondary | ICD-10-CM

## 2023-12-15 DIAGNOSIS — E1169 Type 2 diabetes mellitus with other specified complication: Secondary | ICD-10-CM | POA: Diagnosis not present

## 2023-12-15 DIAGNOSIS — R0609 Other forms of dyspnea: Secondary | ICD-10-CM | POA: Insufficient documentation

## 2023-12-15 DIAGNOSIS — I2510 Atherosclerotic heart disease of native coronary artery without angina pectoris: Secondary | ICD-10-CM

## 2023-12-15 DIAGNOSIS — E785 Hyperlipidemia, unspecified: Secondary | ICD-10-CM

## 2023-12-15 DIAGNOSIS — I10 Essential (primary) hypertension: Secondary | ICD-10-CM | POA: Diagnosis not present

## 2023-12-15 DIAGNOSIS — E119 Type 2 diabetes mellitus without complications: Secondary | ICD-10-CM | POA: Insufficient documentation

## 2023-12-15 NOTE — Progress Notes (Unsigned)
 Cardiology Office Note:    Date:  12/15/2023   ID:  Coby, Antrobus Jan 20, 1946, MRN [id number]  PCP:  Okey Carlin Redbird, MD  Cardiologist:  Lamar Fitch, MD    Referring MD: Okey Carlin Redbird, MD   Chief Complaint  Patient presents with   Follow-up    History of Present Illness:    Lorraine Buckley is a 78 y.o. 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  Comes today to months for follow-up.  Overall doing well.  Denies have any chest pain tightness squeezing pressure in chest no palpitation dizziness swelling of lower extremities  Past Medical History:  Diagnosis Date   Acute bronchitis 06/25/2011   Acute memory impairment    Altered mental status 10/08/2019   Amnesia    Arthritis    FINGERS AND NECK HURT   Asthma    ALLERGY INDUCED ASTHMA   Asthma 06/25/2011   Atypical chest pain 10/08/2019   Body mass index (BMI) 35.0-35.9, adult 08/14/2020   Cardiac arrhythmia 08/14/2020   Cholelithiasis with cholecystitis 03/21/2014   Decreased estrogen level 08/14/2020   Dilated cardiomyopathy (HCC) ejection fraction 45 to 50% 01/17/2020   Dyslipidemia 10/08/2019   Essential hypertension    Flu-like symptoms 06/25/2011   Gallstones    ABD PAIN, BAD GAS. FREQ STOOLS   Heart murmur    PT NOT SURE IF MURMUR STILL HEARD - DOES NOT CAUSE ANY SYMPTOMS   Hyperlipidemia    Hypertension    Hypothyroidism    Impaired fasting glucose 08/14/2020   Mild persistent asthma, uncomplicated 08/14/2020   Multinodular goiter 10/16/2011   Pneumonia    ABOUT FEB 2013   PONV (postoperative nausea and vomiting)    Shortness of breath dyspnea    OCCAS SOB   Thyroid  disease    Thyroid  nodule    Vitamin D deficiency     Past Surgical History:   Procedure Laterality Date   ABDOMINAL HYSTERECTOMY     BUNIONECTOMY     RIGHT BIG TOE   CHOLECYSTECTOMY N/A 03/25/2014   Procedure: LAPAROSCOPIC CHOLECYSTECTOMY WITH INTRAOPERATIVE CHOLANGIOGRAM;  Surgeon: Krystal Spinner, MD;  Location: [facility];  Service: General;  Laterality: N/A;   EYE SURGERY     LASIK EYE SURGERY   TUBAL LIGATION      Current Medications: Current Meds  Medication Sig   albuterol  (PROVENTIL  HFA;VENTOLIN  HFA) 108 (90 BASE) MCG/ACT inhaler Inhale 1-2 puffs into the lungs every 4 (four) hours as needed for wheezing. For breathing   aspirin 81 MG EC tablet Take 1 tablet by mouth daily.   cholecalciferol (VITAMIN D) 1000 UNITS tablet Take 1,000 Units by mouth daily.   Coenzyme Q10 (CO Q10 PO) Take 1 tablet by mouth in the morning.   ezetimibe  (ZETIA ) 10 MG tablet Take 1 tablet (10 mg total) by mouth daily.   FARXIGA 5 MG TABS tablet Take 5 mg by mouth daily.   fexofenadine (ALLEGRA) 180 MG tablet Take 180 mg by mouth daily.   fluticasone  furoate-vilanterol (BREO ELLIPTA) 200-25 MCG/INH AEPB Inhale 1 puff into the lungs daily.   levothyroxine  (SYNTHROID ) 100 MCG tablet Take 100 mcg by mouth daily.   metoprolol  succinate (TOPROL -XL) 50 MG 24 hr tablet Take 1 tablet by mouth once daily   Multiple Vitamin (MULTIVITAMIN PO) Take 1 tablet by  mouth daily. Unknown strength per patient   pravastatin  (PRAVACHOL ) 40 MG tablet Take 1 tablet (40 mg total) by mouth every evening.   RESTASIS  0.05 % ophthalmic emulsion Place 1 drop into both eyes daily.    sacubitril-valsartan (ENTRESTO ) 24-26 MG Take 1 tablet by mouth twice daily     Allergies:   Penicillins and Sulfa  antibiotics   Social History   Socioeconomic History   Marital status: Married    Spouse name: Not on file   Number of children: Not on file   Years of education: Not on file   Highest education level: Not on file  Occupational History   Not on file  Tobacco Use   Smoking status: Never    Passive exposure: Never    Smokeless tobacco: Never  Vaping Use   Vaping status: Never Used  Substance and Sexual Activity   Alcohol use: No   Drug use: No   Sexual activity: Not Currently  Other Topics Concern   Not on file  Social History Narrative   Not on file   Social Drivers of Health   Financial Resource Strain: Not on file  Food Insecurity: Not on file  Transportation Needs: Not on file  Physical Activity: Not on file  Stress: Not on file  Social Connections: Not on file     Family History: The patient's family history includes Asthma in her mother; Breast cancer in her maternal grandmother; Emphysema in her maternal grandfather; Heart disease in her maternal grandfather; Hypertension in her mother; Irritable bowel syndrome in her brother, mother, and sister. ROS:   Please see the history of present illness.    All 14 point review of systems negative except as described per history of present illness  EKGs/Labs/Other Studies Reviewed:    EKG Interpretation Date/Time:  Monday December 15 2023 09:56:36 EDT Ventricular Rate:  64 PR Interval:  166 QRS Duration:  88 QT Interval:  404 QTC Calculation: 416 R Axis:   2  Text Interpretation: Sinus rhythm with marked sinus arrhythmia Cannot rule out Anterior infarct (cited on or before 24-Mar-2014) Abnormal ECG When compared with ECG of 24-Mar-2014 09:38, No significant change was found Confirmed by Bernie Charleston [phone]) on 12/15/2023 10:10:11 AM    Recent Labs: 01/21/2023: ALT 16  Recent Lipid Panel    Component Value Date/Time   CHOL 186 01/21/2023 0841   TRIG 147 01/21/2023 0841   HDL 56 01/21/2023 0841   CHOLHDL 3.3 01/21/2023 0841   LDLCALC 104 (H) 01/21/2023 0841    Physical Exam:    VS:  BP 132/84 (BP Location: Left Arm, Patient Position: Sitting)   Pulse 65   Ht 5' 7 (1.702 m)   Wt 248 lb 12.8 oz (112.9 kg)   SpO2 94%   BMI 38.97 kg/m     Wt Readings from Last 3 Encounters:  12/15/23 248 lb 12.8 oz (112.9 kg)  11/05/22  248 lb (112.5 kg)  05/22/22 250 lb 1.9 oz (113.5 kg)     GEN:  Well nourished, well developed in no acute distress HEENT: Normal NECK: No JVD; No carotid bruits LYMPHATICS: No lymphadenopathy CARDIAC: RRR, no murmurs, no rubs, no gallops RESPIRATORY:  Clear to auscultation without rales, wheezing or rhonchi  ABDOMEN: Soft, non-tender, non-distended MUSCULOSKELETAL:  No edema; No deformity  SKIN: Warm and dry LOWER EXTREMITIES: no swelling NEUROLOGIC:  Alert and oriented x 3 PSYCHIATRIC:  Normal affect   ASSESSMENT:    1. Essential hypertension   2. Type 2  diabetes mellitus with other specified complication, unspecified whether long term insulin use (HCC)   3. Dilated cardiomyopathy (HCC) ejection fraction 45 to 50%   4. Coronary artery disease involving native coronary artery of native heart without angina pectoris   5. Dyslipidemia    PLAN:    In order of problems listed above:  Essential hypertension blood pressure well-controlled continue present management. Dilated cardiomyopathy, we will recheck her left ventricle ejection fraction the meantime continue guideline directed medical therapy. Coronary artery disease with minimal nonobstructive disease based on coronary CT angio, continue risk factors modifications. Dyslipidemia she is able to tolerate pravastatin  which I will continue on the review K PN which show me LDL 82 HDL 58 we will continue present management   Medication Adjustments/Labs and Tests Ordered: Current medicines are reviewed at length with the patient today.  Concerns regarding medicines are outlined above.  Orders Placed This Encounter  Procedures   EKG 12-Lead   Medication changes: No orders of the defined types were placed in this encounter.   Signed, Lamar DOROTHA Fitch, MD, [facility] 12/15/2023 10:20 AM    [facility]

## 2023-12-15 NOTE — Patient Instructions (Addendum)
 Medication Instructions:  Your physician recommends that you continue on your current medications as directed. Please refer to the Current Medication list given to you today.  *If you need a refill on your cardiac medications before your next appointment, please call your pharmacy*   Lab Work: None Ordered If you have labs (blood work) drawn today and your tests are completely normal, you will receive your results only by: MyChart Message (if you have MyChart) OR A paper copy in the mail If you have any lab test that is abnormal or we need to change your treatment, we will call you to review the results.   Testing/Procedures: Your physician has requested that you have an echocardiogram. Echocardiography is a painless test that uses sound waves to create images of your heart. It provides your doctor with information about the size and shape of your heart and how well your heart's chambers and valves are working. This procedure takes approximately one hour. There are no restrictions for this procedure. Please do NOT wear cologne, perfume, aftershave, or lotions (deodorant is allowed). Please arrive 15 minutes prior to your appointment time.  Please note: We ask at that you not bring children with you during ultrasound (echo/ vascular) testing. Due to room size and safety concerns, children are not allowed in the ultrasound rooms during exams. Our front office staff cannot provide observation of children in our lobby area while testing is being conducted. An adult accompanying a patient to their appointment will only be allowed in the ultrasound room at the discretion of the ultrasound technician under special circumstances. We apologize for any inconvenience.    Follow-Up: At [facility], you and your health needs are our priority.  As part of our continuing mission to provide you with exceptional heart care, we have created designated Provider Care Teams.  These Care Teams include your  primary Cardiologist (physician) and Advanced Practice Providers (APPs -  Physician Assistants and Nurse Practitioners) who all work together to provide you with the care you need, when you need it.  We recommend signing up for the patient portal called "MyChart".  Sign up information is provided on this After Visit Summary.  MyChart is used to connect with patients for Virtual Visits (Telemedicine).  Patients are able to view lab/test results, encounter notes, upcoming appointments, etc.  Non-urgent messages can be sent to your provider as well.   To learn more about what you can do with MyChart, go to [email or web address].    Your next appointment:   12 month(s)  The format for your next appointment:   In Person  Provider:   Gypsy Balsam, MD    Other Instructions NA

## 2023-12-16 LAB — ECHOCARDIOGRAM COMPLETE
AR max vel: 1.87 cm2
AV Area VTI: 1.95 cm2
AV Area mean vel: 1.88 cm2
AV Mean grad: 6 mmHg
AV Peak grad: 10.8 mmHg
Ao pk vel: 1.64 m/s
Area-P 1/2: 3.5 cm2
Calc EF: 58.1 %
Height: 67 in
MV M vel: 3.13 m/s
MV Peak grad: 39.2 mmHg
S' Lateral: 3.6 cm
Single Plane A2C EF: 59.8 %
Single Plane A4C EF: 57.2 %
Weight: 3980.8 [oz_av]

## 2023-12-18 ENCOUNTER — Ambulatory Visit: Payer: Self-pay | Admitting: Cardiology

## 2023-12-21 ENCOUNTER — Other Ambulatory Visit: Payer: Self-pay | Admitting: Cardiology

## 2023-12-23 ENCOUNTER — Telehealth: Payer: Self-pay

## 2023-12-23 NOTE — Telephone Encounter (Signed)
 Pt viewed Echo results on My Chart per Dr. Vanetta Shawl note. Routed to PCP.

## 2024-01-13 ENCOUNTER — Other Ambulatory Visit: Payer: Self-pay | Admitting: Family Medicine

## 2024-01-13 DIAGNOSIS — Z1231 Encounter for screening mammogram for malignant neoplasm of breast: Secondary | ICD-10-CM

## 2024-01-15 ENCOUNTER — Other Ambulatory Visit: Payer: Self-pay | Admitting: Cardiology

## 2024-02-09 ENCOUNTER — Ambulatory Visit
Admission: RE | Admit: 2024-02-09 | Discharge: 2024-02-09 | Disposition: A | Discharge status: Home / Self Care | Source: Ambulatory Visit | Attending: Family Medicine | Admitting: Family Medicine

## 2024-02-09 DIAGNOSIS — Z1231 Encounter for screening mammogram for malignant neoplasm of breast: Secondary | ICD-10-CM

## 2024-02-13 DIAGNOSIS — E119 Type 2 diabetes mellitus without complications: Secondary | ICD-10-CM | POA: Diagnosis not present

## 2024-02-13 DIAGNOSIS — E039 Hypothyroidism, unspecified: Secondary | ICD-10-CM | POA: Diagnosis not present

## 2024-02-17 DIAGNOSIS — J4530 Mild persistent asthma, uncomplicated: Secondary | ICD-10-CM | POA: Diagnosis not present

## 2024-02-17 DIAGNOSIS — Z6841 Body Mass Index (BMI) 40.0 and over, adult: Secondary | ICD-10-CM | POA: Diagnosis not present

## 2024-02-17 DIAGNOSIS — E119 Type 2 diabetes mellitus without complications: Secondary | ICD-10-CM | POA: Diagnosis not present
